# Patient Record
Sex: Male | Born: 1960 | Hispanic: No | Smoking: Former smoker
Health system: Southern US, Community
[De-identification: ages and names within clinical notes are randomized; demographics above are authoritative.]

## PROBLEM LIST (undated history)

## (undated) DIAGNOSIS — N200 Calculus of kidney: Secondary | ICD-10-CM

## (undated) DIAGNOSIS — I10 Essential (primary) hypertension: Secondary | ICD-10-CM

## (undated) DIAGNOSIS — F419 Anxiety disorder, unspecified: Secondary | ICD-10-CM

## (undated) DIAGNOSIS — E785 Hyperlipidemia, unspecified: Secondary | ICD-10-CM

## (undated) DIAGNOSIS — K219 Gastro-esophageal reflux disease without esophagitis: Secondary | ICD-10-CM

## (undated) DIAGNOSIS — E119 Type 2 diabetes mellitus without complications: Secondary | ICD-10-CM

## (undated) DIAGNOSIS — R7989 Other specified abnormal findings of blood chemistry: Secondary | ICD-10-CM

## (undated) HISTORY — PX: THYROIDECTOMY: SHX17

## (undated) HISTORY — DX: Gastro-esophageal reflux disease without esophagitis: K21.9

## (undated) HISTORY — DX: Calculus of kidney: N20.0

## (undated) HISTORY — DX: Other specified abnormal findings of blood chemistry: R79.89

## (undated) HISTORY — DX: Anxiety disorder, unspecified: F41.9

## (undated) HISTORY — DX: Hyperlipidemia, unspecified: E78.5

## (undated) HISTORY — DX: Essential (primary) hypertension: I10

## (undated) HISTORY — DX: Type 2 diabetes mellitus without complications: E11.9

---
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
## 1978-07-04 HISTORY — PX: KNEE CARTILAGE SURGERY: SHX688

## 1998-07-04 HISTORY — PX: KIDNEY STONE SURGERY: SHX686

## 2010-03-01 ENCOUNTER — Encounter (INDEPENDENT_AMBULATORY_CARE_PROVIDER_SITE_OTHER): Payer: Self-pay | Admitting: *Deleted

## 2010-05-04 ENCOUNTER — Encounter (INDEPENDENT_AMBULATORY_CARE_PROVIDER_SITE_OTHER): Payer: Self-pay | Admitting: *Deleted

## 2010-05-07 ENCOUNTER — Encounter (INDEPENDENT_AMBULATORY_CARE_PROVIDER_SITE_OTHER): Payer: Self-pay | Admitting: *Deleted

## 2010-05-07 ENCOUNTER — Ambulatory Visit: Payer: Self-pay | Admitting: Internal Medicine

## 2010-05-17 ENCOUNTER — Telehealth (INDEPENDENT_AMBULATORY_CARE_PROVIDER_SITE_OTHER): Payer: Self-pay | Admitting: *Deleted

## 2010-07-29 ENCOUNTER — Telehealth (INDEPENDENT_AMBULATORY_CARE_PROVIDER_SITE_OTHER): Payer: Self-pay | Admitting: *Deleted

## 2010-07-30 ENCOUNTER — Other Ambulatory Visit: Payer: Self-pay | Admitting: Internal Medicine

## 2010-07-30 ENCOUNTER — Ambulatory Visit
Admission: RE | Admit: 2010-07-30 | Discharge: 2010-07-30 | Payer: Self-pay | Source: Home / Self Care | Attending: Internal Medicine | Admitting: Internal Medicine

## 2010-07-30 DIAGNOSIS — D126 Benign neoplasm of colon, unspecified: Secondary | ICD-10-CM

## 2010-07-30 LAB — GLUCOSE, CAPILLARY
Glucose-Capillary: 130 mg/dL — ABNORMAL HIGH (ref 70–99)
Glucose-Capillary: 122 mg/dL — ABNORMAL HIGH (ref 70–99)

## 2010-08-03 NOTE — Progress Notes (Signed)
Summary: Prep questions for upcoming Colon  Phone Note Call from Patient Call back at [phone]   Call For: Dr Leone Payor Reason for Call: Talk to Nurse Summary of Call: Has some questions about the prep for his colon. Initial call taken by: Leanor Kail [facility],  May 17, 2010 8:50 AM  Follow-up for Phone Call        called pt; no answer.  Will try later. Ezra Sites RN  May 17, 2010 9:31 AM  Pt ate breakfast today and has not  followed instructions for holding certain foods 5 days before procedure.  He does not want to have colonoscopy tomorrow; he prefers to wait until February.  He will call back and reschedule. Follow-up by: Ezra Sites RN,  May 17, 2010 9:39 AM

## 2010-08-03 NOTE — Letter (Signed)
Summary: [name] Instructions  [facility]  [address] [place], [place] [address]   Phone: [phone]  Fax: [phone]       Charles Burton    24-Jan-1961    MRN: [id number]        Procedure Day Dorna Bloom:  [name] [birth year]     Arrival Time: 10:30am     Procedure Time: 11:30am     Location of Procedure:                    Juliann Pares _  [facility] (4th Floor)                        PREPARATION FOR COLONOSCOPY WITH MOVIPREP   Starting 5 days prior to your procedure  THURSDAY 11/10 do not eat nuts, seeds, popcorn, corn, beans, peas,  salads, or any raw vegetables.  Do not take any fiber supplements (e.g. Metamucil, Citrucel, and Benefiber).  THE DAY BEFORE YOUR PROCEDURE         DATE:  MONDAY  11/14   1.  Drink clear liquids the entire day-NO SOLID FOOD  2.  Do not drink anything colored red or purple.  Avoid juices with pulp.  No orange juice.  3.  Drink at least 64 oz. (8 glasses) of fluid/clear liquids during the day to prevent dehydration and help the prep work efficiently.  CLEAR LIQUIDS INCLUDE: Water Jello Ice Popsicles Tea (sugar ok, no milk/cream) Powdered fruit flavored drinks Coffee (sugar ok, no milk/cream) Gatorade Juice: apple, white grape, white cranberry  Lemonade Clear bullion, consomm, broth Carbonated beverages (any kind) Strained chicken noodle soup Hard Candy                             4.  In the morning, mix first dose of MoviPrep solution:    Empty 1 Pouch A and 1 Pouch B into the disposable container    Add lukewarm drinking water to the top line of the container. Mix to dissolve    Refrigerate (mixed solution should be used within 24 hrs)  5.  Begin drinking the prep at 5:00 p.m. The MoviPrep container is divided by 4 marks.   Every 15 minutes drink the solution down to the next mark (approximately 8 oz) until the full liter is complete.   6.  Follow completed prep with 16 oz of clear liquid of your choice  (Nothing red or purple).  Continue to drink clear liquids until bedtime.  7.  Before going to bed, mix second dose of MoviPrep solution:    Empty 1 Pouch A and 1 Pouch B into the disposable container    Add lukewarm drinking water to the top line of the container. Mix to dissolve    Refrigerate  THE DAY OF YOUR PROCEDURE      DATE:  TUESDAY  11/15  Beginning at  6:30 a.m. (5 hours before procedure):         1. Every 15 minutes, drink the solution down to the next mark (approx 8 oz) until the full liter is complete.  2. Follow completed prep with 16 oz. of clear liquid of your choice.    3. You may drink clear liquids until 9:30am  (2 HOURS BEFORE PROCEDURE).   MEDICATION INSTRUCTIONS  Unless otherwise instructed, you should take regular prescription medications with a small sip of water   as early as  possible the morning of your procedure.  Diabetic patients - see separate instructions.        OTHER INSTRUCTIONS  You will need a responsible adult at least 52 years of age to accompany you and drive you home.   This person must remain in the waiting room during your procedure.  Wear loose fitting clothing that is easily removed.  Leave jewelry and other valuables at home.  However, you may wish to bring a book to read or  an iPod/MP3 player to listen to music as you wait for your procedure to start.  Remove all body piercing jewelry and leave at home.  Total time from sign-in until discharge is approximately 2-3 hours.  You should go home directly after your procedure and rest.  You can resume normal activities the  day after your procedure.  The day of your procedure you should not:   Drive   Make legal decisions   Operate machinery   Drink alcohol   Return to work  You will receive specific instructions about eating, activities and medications before you leave.    The above instructions have been reviewed and explained to me by   Ezra Sites RN   May 07, 2010 11:02 AM     I fully understand and can verbalize these instructions _____________________________ Date _________

## 2010-08-03 NOTE — Miscellaneous (Signed)
Summary: [facility]  Clinical Lists Changes  Medications: Added new medication of MOVIPREP 100 GM  SOLR (PEG-KCL-NACL-NASULF-NA ASC-C) As per prep instructions. - Signed Rx of MOVIPREP 100 GM  SOLR (PEG-KCL-NACL-NASULF-NA ASC-C) As per prep instructions.;  #1 x 0;  Signed;  Entered by: Ezra Sites RN;  Authorized by: Iva Boop MD, [facility];  Method used: [facility]  [address] [phone]*, [address], [place], [place], [place]  [address], Ph: [phone] or [phone], Fax: [phone]    Prescriptions: MOVIPREP 100 GM  SOLR (PEG-KCL-NACL-NASULF-NA ASC-C) As per prep instructions.  #1 x 0   Entered by:   Ezra Sites RN   Authorized by:   Iva Boop MD, [facility]   Signed by:   Ezra Sites RN on 05/07/2010   Method used:   [facility]        [facility]  [address] [phone][facility] (retail)       [address]       [place], [place]  [address]       Ph: [phone] or [phone]       Fax: [phone]   RxID:   [id number]

## 2010-08-03 NOTE — Letter (Signed)
Summary: Previsit letter  [facility]  [address] [place], [place] [address]   Phone: [phone]  Fax: [phone]       03/01/2010 MRN: [id number]  Charles Burton [address] [place], [place]  [address]  Dear [name],  Welcome to the Gastroenterology Division at [facility].    You are scheduled to see a nurse for your pre-procedure visit on 03-25-2010 at 1:30pm on the 3rd floor at [facility], [address]  We ask that you try to arrive at our office 15 minutes prior to your appointment time to allow for check-in.  Your nurse visit will consist of discussing your medical and surgical history, your immediate family medical history, and your medications.    Please bring a complete list of all your medications or, if you prefer, bring the medication bottles and we will list them.  We will need to be aware of both prescribed and over the counter drugs.  We will need to know exact dosage information as well.  If you are on blood thinners (Coumadin, Plavix, Aggrenox, Ticlid, etc.) please call our office today/prior to your appointment, as we need to consult with your physician about holding your medication.   Please be prepared to read and sign documents such as consent forms, a financial agreement, and acknowledgement forms.  If necessary, and with your consent, a friend or relative is welcome to sit-in on the nurse visit with you.  Please bring your insurance card so that we may make a copy of it.  If your insurance requires a referral to see a specialist, please bring your referral form from your primary care physician.  No co-pay is required for this nurse visit.     If you cannot keep your appointment, please call [phone] to cancel or reschedule prior to your appointment date.  This allows [place] the opportunity to schedule an appointment for another patient in need of care.    Thank you for choosing [facility] for your medical needs.  We  appreciate the opportunity to care for you.  Please visit [place] at our website  to learn more about our practice.                     Sincerely.                                                                                                                   The Gastroenterology Division

## 2010-08-03 NOTE — Letter (Signed)
Summary: Diabetic Instructions  [facility]  [address] [place], [place] [address]   Phone: [phone]  Fax: [phone]    Charles Burton Aug 29, 1960 MRN: [id number]   _  _   ORAL DIABETIC MEDICATION INSTRUCTIONS  The day before your procedure:   Take your diabetic pill as you do normally  The day of your procedure:   Do not take your diabetic pill    We will check your blood sugar levels during the admission process and again in Recovery before discharging you home  ________________________________________________________________________

## 2010-08-04 ENCOUNTER — Encounter: Payer: Self-pay | Admitting: Internal Medicine

## 2010-08-05 NOTE — Progress Notes (Signed)
Summary: Medication  Phone Note From Pharmacy   Caller: [facility] [place] [phone] Call For: Dr. Leone Payor  Summary of Call: Moviprep is too expensive.Marland Kitchen..Marland Kitchenneeds an alternative med. Initial call taken by: Karna Christmas,  July 29, 2010 8:52 AM  Follow-up for Phone Call        Talked w/ pharmacist.  will mail rebate to pt. Follow-up by: Ezra Sites RN,  July 29, 2010 9:41 AM

## 2010-08-05 NOTE — Progress Notes (Signed)
Summary: Prep meds for procedure tomorrow  Phone Note Call from Patient Call back at Home Phone [phone]   Call For: Dr Leone Payor Summary of Call: [facility]  Initial call taken by: Leanor Kail [facility],  July 29, 2010 8:28 AM  Follow-up for Phone Call        Spoke w/ pt to review updated prep instructions.  [facility] [place],  Follow-up by: Ezra Sites RN,  July 29, 2010 8:37 AM

## 2010-08-05 NOTE — Procedures (Addendum)
Summary: Colonoscopy  Patient: Charles Burton Note: All result statuses are Final unless otherwise noted.  Tests: (1) Colonoscopy (COL)   COL Colonoscopy           DONE      [facility]     [address]     [place], [place]  [address]           COLONOSCOPY PROCEDURE REPORT           PATIENT:  [name], [name]  MR#:  [id number]     BIRTHDATE:  1960-08-20, 49 yrs. old  GENDER:  male     ENDOSCOPIST:  Iva Boop, MD, [facility]     REF. BY:  Nila Nephew, M.D.     PROCEDURE DATE:  07/30/2010     PROCEDURE:  Colonoscopy with biopsy and snare polypectomy     ASA CLASS:  Class II     INDICATIONS:  Routine Risk Screening     MEDICATIONS:   Fentanyl 50 mcg IV, Versed 8 mg IV           DESCRIPTION OF PROCEDURE:   After the risks benefits and     alternatives of the procedure were thoroughly explained, informed     consent was obtained.  Digital rectal exam was performed and     revealed no abnormalities and normal prostate.   The LB CF-H180AL     [id number] endoscope was introduced through the anus and advanced to     the cecum, which was identified by both the appendix and ileocecal     valve, without limitations.  The quality of the prep was     excellent, using MoviPrep.  The instrument was then slowly     withdrawn as the colon was fully examined. Insertion: 1:55 minutes     Withdrawal: 17:21 minutes     <<PROCEDUREIMAGES>>           FINDINGS:  Two polyps were found. They were diminutive. The     descending polyp (4-1mm) was snared without cautery. Retrieval was     successful. The possible splenic flexure polyp (2-20mm) was removed     using cold biopsy forceps.  Moderate diverticulosis was found in     the sigmoid colon.  This was otherwise a normal examination of the     colon.   Retroflexed views in the right colon and rectum revealed     no abnormalities.    The scope was then withdrawn from the patient     and the procedure completed.           COMPLICATIONS:  None  ENDOSCOPIC IMPRESSION:     1) Two diminutive polyps removed from left colon     2) Moderate diverticulosis in the sigmoid colon     3) Otherwise normal examination with excellent prep           REPEAT EXAM:  In for Colonoscopy, pending biopsy results.           Iva Boop, MD, [facility]           CC:  Nila Nephew, MD     The Patient           n.     Rosalie Doctor:   Iva Boop at 07/30/2010 09:05 AM           Jeri Modena, [phone]  Note: An exclamation mark (!) indicates a result that was not dispersed into the flowsheet. Document Creation Date: 07/30/2010  9:06 AM _______________________________________________________________________  (1) Order result status: Final Collection or observation date-time: 07/30/2010 08:56 Requested date-time:  Receipt date-time:  Reported date-time:  Referring Physician:   Ordering Physician: Stan Head [phone]) Specimen Source:  Source: Launa Grill Order Number: [id number] Lab site:   Appended Document: Colonoscopy     Procedures Next Due Date:    Colonoscopy: 07/2020

## 2010-08-11 NOTE — Letter (Signed)
Summary: Patient Notice-Hyperplastic Polyps  [facility]  [address] [place], [place] [address]   Phone: [phone]  Fax: [phone]        August 04, 2010 MRN: [id number]    HOMERO HYSON [address] [place], [place]  [address]    Dear Mr. MEROLLA,  I am pleased to inform you that the colon polyps removed during your recent colonoscopy were found to be hyperplastic.  These types of polyps are NOT pre-cancerous.  It is therefore my recommendation that you have a repeat colonoscopy examination in 10 years for routine colorectal cancer screening.  In addition to repeating colonoscopy, changing health habits may reduce your risk of having more colon or rectal  polyps and possibly, colorectal cancer. You may lower your risk of future polyps and colorectal cancer by adopting healthy habits such as not smoking or using tobacco (if you do), being physically active, losing weight (if overweight), and eating a diet which includes fruits and vegetables and limits red meat.  Should you develop new or worsening symptoms of abdominal pain, bowel habit changes or bleeding from the rectum or bowels, please schedule an evaluation with either your primary care physician or with me.  Please call us if you are having persistent problems or have questions about your condition that have not been fully answered at this time.  Sincerely,  Iva Boop MD, [facility] This letter has been electronically signed by your physician.  Appended Document: Patient Notice-Hyperplastic Polyps letter mailed

## 2012-04-16 ENCOUNTER — Encounter (INDEPENDENT_AMBULATORY_CARE_PROVIDER_SITE_OTHER): Payer: Self-pay

## 2012-05-01 ENCOUNTER — Ambulatory Visit (INDEPENDENT_AMBULATORY_CARE_PROVIDER_SITE_OTHER): Payer: Self-pay | Admitting: General Surgery

## 2012-06-05 ENCOUNTER — Encounter (INDEPENDENT_AMBULATORY_CARE_PROVIDER_SITE_OTHER): Payer: Self-pay | Admitting: General Surgery

## 2012-06-05 ENCOUNTER — Ambulatory Visit (INDEPENDENT_AMBULATORY_CARE_PROVIDER_SITE_OTHER): Payer: PRIVATE HEALTH INSURANCE | Admitting: General Surgery

## 2012-06-05 VITALS — BP 128/86 | HR 72 | Temp 98.2°F | Resp 16 | Ht 70.0 in | Wt 194.0 lb

## 2012-06-05 DIAGNOSIS — K429 Umbilical hernia without obstruction or gangrene: Secondary | ICD-10-CM

## 2012-06-05 NOTE — H&P (Signed)
Charles Burton is an 51 y.o. male.   Chief Complaint: Periumbilical pain with hernia HPI: Has had umbilical hernia for > 10 years, increaingly symptomatic over the past several months with some weight gain.  Never incarcerated.  Gets worse with abdominal bloating.  Past Medical History  Diagnosis Date  . Hyperlipidemia   . Kidney stones   . Diabetes mellitus without complication   . Hypertension   . Anxiety   . Low testosterone   . GERD (gastroesophageal reflux disease)     Past Surgical History  Procedure Date  . Thyroidectomy   . Knee cartilage surgery 1980  . Nose surgery 1978  . Kidney stone surgery 2000    No family history on file. Social History:  reports that he has been smoking Cigars.  He does not have any smokeless tobacco history on file. He reports that he drinks alcohol. His drug history not on file.  Allergies: Allergies no known allergies   (Not in a hospital admission)  No results found for this or any previous visit (from the past 48 hour(s)). No results found.  Review of Systems  Constitutional: Negative.   HENT: Negative.   Gastrointestinal: Positive for abdominal pain (periumbilical).  Genitourinary: Negative.   Musculoskeletal: Negative.   Skin: Negative.   Neurological: Negative.   Endo/Heme/Allergies: Negative.   Psychiatric/Behavioral: Positive for depression.    Blood pressure 128/86, pulse 72, temperature 98.2 F (36.8 C), temperature source Temporal, resp. rate 16, height 5\' 10"  (1.778 m), weight 194 lb (87.998 kg). Physical Exam  Constitutional: He is oriented to person, place, and time. He appears well-developed and well-nourished.  HENT:  Head: Normocephalic and atraumatic.  Eyes: Conjunctivae normal and EOM are normal. Pupils are equal, round, and reactive to light.  Neck: Normal range of motion. Neck supple.  Cardiovascular: Normal rate, regular rhythm, normal heart sounds and intact distal pulses.   Respiratory: Effort normal  and breath sounds normal.  GI: Soft. Normal appearance. There is tenderness in the periumbilical area. A hernia is present. Hernia confirmed positive in the ventral area (umbilical). Hernia confirmed negative in the right inguinal area and confirmed negative in the left inguinal area.    Musculoskeletal: Normal range of motion.  Neurological: He is alert and oriented to person, place, and time. He has normal reflexes.  Skin: Skin is warm and dry.  Psychiatric: He has a normal mood and affect. His behavior is normal. Judgment and thought content normal.     Assessment/Plan Umbilical hernia with symptoms, reducible  Repair with possible circular mesh as outpatient. Perioperative antibiotics  Ivone Licht O 06/05/2012, 9:57 AM

## 2012-06-05 NOTE — Progress Notes (Signed)
Office visit note entered as H&P  Charles Burton. Gae Bon, MD, FACS [phone] [facility]

## 2012-11-30 ENCOUNTER — Other Ambulatory Visit (HOSPITAL_COMMUNITY): Payer: Self-pay | Admitting: Internal Medicine

## 2012-11-30 DIAGNOSIS — R079 Chest pain, unspecified: Secondary | ICD-10-CM

## 2012-12-20 ENCOUNTER — Ambulatory Visit (HOSPITAL_COMMUNITY)
Admission: RE | Admit: 2012-12-20 | Discharge: 2012-12-20 | Disposition: A | Discharge status: Home / Self Care | Payer: No Typology Code available for payment source | Source: Ambulatory Visit | Attending: Internal Medicine | Admitting: Internal Medicine

## 2012-12-20 DIAGNOSIS — R079 Chest pain, unspecified: Secondary | ICD-10-CM | POA: Insufficient documentation

## 2016-10-08 ENCOUNTER — Encounter (HOSPITAL_COMMUNITY): Payer: Self-pay | Admitting: Emergency Medicine

## 2016-10-08 ENCOUNTER — Ambulatory Visit (HOSPITAL_COMMUNITY)
Admission: EM | Admit: 2016-10-08 | Discharge: 2016-10-08 | Disposition: A | Discharge status: Home / Self Care | Payer: BLUE CROSS/BLUE SHIELD | Attending: Internal Medicine | Admitting: Internal Medicine

## 2016-10-08 DIAGNOSIS — J111 Influenza due to unidentified influenza virus with other respiratory manifestations: Secondary | ICD-10-CM

## 2016-10-08 DIAGNOSIS — R69 Illness, unspecified: Secondary | ICD-10-CM | POA: Diagnosis not present

## 2016-10-08 DIAGNOSIS — H6121 Impacted cerumen, right ear: Secondary | ICD-10-CM

## 2016-10-08 MED ORDER — OSELTAMIVIR PHOSPHATE 75 MG PO CAPS: 75.0000 mg | ORAL_CAPSULE | Freq: Two times a day (BID) | ORAL | 0 refills | Status: DC

## 2016-10-08 MED ORDER — BENZONATATE 100 MG PO CAPS: 100.0000 mg | ORAL_CAPSULE | Freq: Three times a day (TID) | ORAL | 0 refills | Status: DC

## 2016-10-08 NOTE — Discharge Instructions (Signed)
You most likely have a viral URI such as influenza or an influenza like illness, I advise rest, plenty of fluids and management of symptoms with over the counter medicines. For symptoms you may take Tylenol as needed every 4-6 hours for body aches or fever, not to exceed 4,000 mg a day, Take mucinex or mucinex DM ever 12 hours with a full glass of water, you may use an inhaled steroid such as Flonase, 2 sprays each nostril once a day for congestion, or an antihistamine such as Claritin or Zyrtec once a day. For treatment of influenza, I have prescribed Tamiflu. Take 1 tablet twice a day for 5 days. For cough, I have prescribed a medication called Tessalon. Take 1 tablet every 8 hours as needed for your cough.  Should your symptoms worsen or fail to resolve, follow up with your primary care provider or return to clinic.

## 2016-10-08 NOTE — ED Triage Notes (Signed)
Cough, fever, productive cough of dark brown phlegm and streaks of blood.  Reports fever of 103 today.  Onset of symptoms 2 days ago and worsened over the past 2 days

## 2016-10-08 NOTE — ED Provider Notes (Signed)
CSN: [id number]     Arrival date & time 10/08/16  1429 History   First MD Initiated Contact with Patient 10/08/16 1610     Chief Complaint  Patient presents with  . URI   (Consider location/radiation/quality/duration/timing/severity/associated sxs/prior Treatment) 56 year old male presents with a 24-hour history of headache, body ache, muscle aches, fever, and fatigue unrelieved with OTC medications.   The history is provided by the patient.  URI  Presenting symptoms: congestion, cough, fatigue, fever and sore throat   Cough:    Cough characteristics:  Non-productive, dry and hacking   Sputum characteristics:  [place]   Severity:  Moderate   Onset quality:  Gradual   Duration:  1 day   Timing:  Intermittent   Progression:  Worsening   Chronicity:  New Severity:  Moderate Onset quality:  Gradual Duration:  1 day Timing:  Constant Progression:  Worsening Chronicity:  New Relieved by:  Nothing Worsened by:  Nothing Ineffective treatments:  Decongestant, OTC medications and rest Associated symptoms: arthralgias, headaches, myalgias and sinus pain   Associated symptoms: no neck pain, no sneezing and no wheezing     Past Medical History:  Diagnosis Date  . Anxiety   . Diabetes mellitus without complication ([place])   . GERD (gastroesophageal reflux disease)   . Hyperlipidemia   . Hypertension   . Kidney stones   . Low testosterone    Past Surgical History:  Procedure Laterality Date  . KIDNEY STONE SURGERY  2000  . [employer]  . NOSE SURGERY  1978  . THYROIDECTOMY     No family history on file. Social History  Substance Use Topics  . Smoking status: Current Some Day Smoker    Types: Cigars  . Smokeless tobacco: Not on file  . Alcohol use Yes    Review of Systems  Constitutional: Positive for appetite change, chills, fatigue and fever.  HENT: Positive for congestion, sinus pain and sore throat. Negative for sneezing and trouble swallowing.   Eyes:  Negative for itching and visual disturbance.  Respiratory: Positive for cough. Negative for wheezing.   Cardiovascular: Negative for chest pain and palpitations.  Gastrointestinal: Positive for nausea. Negative for abdominal pain, constipation and vomiting.  Genitourinary: Positive for dysuria and frequency.  Musculoskeletal: Positive for arthralgias and myalgias. Negative for neck pain and neck stiffness.  Neurological: Positive for headaches. Negative for dizziness and light-headedness.    Allergies  Patient has no known allergies.  Home Medications   Prior to Admission medications   Medication Sig Start Date End Date Taking? Authorizing Provider  amphetamine-dextroamphetamine (ADDERALL) 20 MG tablet Take 20 mg by mouth daily.   Yes Historical Provider, MD  aspirin 81 MG tablet Take 81 mg by mouth daily.   Yes Historical Provider, MD  atorvastatin (LIPITOR) 80 MG tablet  05/18/12  Yes Historical Provider, MD  buPROPion (WELLBUTRIN XL) 300 MG 24 hr tablet Take 300 mg by mouth daily.   Yes Historical Provider, MD  clonazePAM (KLONOPIN) 0.5 MG tablet Take 0.5 mg by mouth 3 (three) times daily as needed.   Yes Historical Provider, MD  sertraline (ZOLOFT) 100 MG tablet Take 100 mg by mouth daily.   Yes Historical Provider, MD  benzonatate (TESSALON) 100 MG capsule Take 1 capsule (100 mg total) by mouth every 8 (eight) hours. 10/08/16   Barnet Glasgow, NP  glimepiride (AMARYL) 2 MG tablet Take 2 mg by mouth daily before breakfast.    Historical Provider, MD  Lancets (ONETOUCH ULTRASOFT)  lancets  04/04/12   Historical Provider, MD  lisinopril (PRINIVIL,ZESTRIL) 20 MG tablet Take 20 mg by mouth daily.    Historical Provider, MD  ONE TOUCH ULTRA TEST test strip  04/04/12   Historical Provider, MD  oseltamivir (TAMIFLU) 75 MG capsule Take 1 capsule (75 mg total) by mouth every 12 (twelve) hours. 10/08/16   Barnet Glasgow, NP  pantoprazole (PROTONIX) 40 MG tablet Take 40 mg by mouth daily.     Historical Provider, MD  tadalafil (CIALIS) 20 MG tablet Take 20 mg by mouth daily as needed.    Historical Provider, MD  Testosterone (TESTIM TD) Place onto the skin.    Historical Provider, MD  triazolam (HALCION) 0.25 MG tablet Take 0.25 mg by mouth at bedtime as needed.    Historical Provider, MD   Meds Ordered and Administered this Visit  Medications - No data to display  BP 128/82 (BP Location: Left Arm)   Pulse 98   Temp 100 F (37.8 C) (Oral)   Resp 18   SpO2 98%  No data found.   Physical Exam  Constitutional: He is oriented to person, place, and time. He appears well-developed and well-nourished. He appears ill. No distress.  HENT:  Head: Normocephalic and atraumatic.  Right Ear: External ear normal.  Left Ear: Tympanic membrane and external ear normal.  Nose: Nose normal. Right sinus exhibits no maxillary sinus tenderness and no frontal sinus tenderness. Left sinus exhibits no maxillary sinus tenderness and no frontal sinus tenderness.  Mouth/Throat: Uvula is midline and oropharynx is clear and moist. No oropharyngeal exudate.  Right sided cerumen impaction  Eyes: Conjunctivae are normal. Right eye exhibits no discharge. Left eye exhibits no discharge.  Neck: Normal range of motion. Neck supple. No JVD present.  Cardiovascular: Normal rate and regular rhythm.   Pulmonary/Chest: Effort normal and breath sounds normal. No respiratory distress. He has no wheezes.  Abdominal: Soft. Bowel sounds are normal.  Lymphadenopathy:    He has no cervical adenopathy.  Neurological: He is alert and oriented to person, place, and time.  Skin: Skin is warm and dry. Capillary refill takes less than 2 seconds. No rash noted. He is not diaphoretic. No erythema.  Psychiatric: He has a normal mood and affect. His behavior is normal.  Nursing note and vitals reviewed.   Urgent Care Course     Procedures (including critical care time)  Labs Review Labs Reviewed - No data to  display  Imaging Review No results found.     MDM   1. Influenza-like illness   2. Impacted cerumen of right ear     Cerumen impaction cleared, TM appearing intact and normal. Pt expressing relief.  For influenza like illness, treating with Tamiflu and tessalon, provided counseling on OTC medications for symptom relief and to rest and drink fluids. Advised to follow up with PCP if symptoms persist or fail to resolve.    Barnet Glasgow, NP 10/08/16 1650

## 2017-07-19 DIAGNOSIS — E291 Testicular hypofunction: Secondary | ICD-10-CM | POA: Diagnosis not present

## 2017-07-22 DIAGNOSIS — J209 Acute bronchitis, unspecified: Secondary | ICD-10-CM | POA: Diagnosis not present

## 2017-10-13 DIAGNOSIS — J40 Bronchitis, not specified as acute or chronic: Secondary | ICD-10-CM | POA: Diagnosis not present

## 2017-10-13 DIAGNOSIS — E119 Type 2 diabetes mellitus without complications: Secondary | ICD-10-CM | POA: Diagnosis not present

## 2017-10-17 ENCOUNTER — Ambulatory Visit
Admission: RE | Admit: 2017-10-17 | Discharge: 2017-10-17 | Disposition: A | Discharge status: Home / Self Care | Payer: BLUE CROSS/BLUE SHIELD | Source: Ambulatory Visit | Attending: Internal Medicine | Admitting: Internal Medicine

## 2017-10-17 ENCOUNTER — Other Ambulatory Visit: Payer: Self-pay | Admitting: Internal Medicine

## 2017-10-17 DIAGNOSIS — R059 Cough, unspecified: Secondary | ICD-10-CM

## 2017-10-17 DIAGNOSIS — R066 Hiccough: Secondary | ICD-10-CM

## 2017-10-17 DIAGNOSIS — R05 Cough: Secondary | ICD-10-CM | POA: Diagnosis not present

## 2017-10-18 DIAGNOSIS — J988 Other specified respiratory disorders: Secondary | ICD-10-CM | POA: Diagnosis not present

## 2017-10-25 DIAGNOSIS — R066 Hiccough: Secondary | ICD-10-CM | POA: Diagnosis not present

## 2017-10-25 DIAGNOSIS — E119 Type 2 diabetes mellitus without complications: Secondary | ICD-10-CM | POA: Diagnosis not present

## 2017-10-25 DIAGNOSIS — I10 Essential (primary) hypertension: Secondary | ICD-10-CM | POA: Diagnosis not present

## 2018-02-16 DIAGNOSIS — E1165 Type 2 diabetes mellitus with hyperglycemia: Secondary | ICD-10-CM | POA: Diagnosis not present

## 2018-02-16 DIAGNOSIS — I10 Essential (primary) hypertension: Secondary | ICD-10-CM | POA: Diagnosis not present

## 2018-02-16 DIAGNOSIS — N529 Male erectile dysfunction, unspecified: Secondary | ICD-10-CM | POA: Diagnosis not present

## 2018-03-14 DIAGNOSIS — E291 Testicular hypofunction: Secondary | ICD-10-CM | POA: Diagnosis not present

## 2018-03-14 DIAGNOSIS — F909 Attention-deficit hyperactivity disorder, unspecified type: Secondary | ICD-10-CM | POA: Diagnosis not present

## 2018-03-14 DIAGNOSIS — E1165 Type 2 diabetes mellitus with hyperglycemia: Secondary | ICD-10-CM | POA: Diagnosis not present

## 2018-03-14 DIAGNOSIS — I10 Essential (primary) hypertension: Secondary | ICD-10-CM | POA: Diagnosis not present

## 2018-03-14 DIAGNOSIS — Z23 Encounter for immunization: Secondary | ICD-10-CM | POA: Diagnosis not present

## 2018-03-14 DIAGNOSIS — Z1211 Encounter for screening for malignant neoplasm of colon: Secondary | ICD-10-CM | POA: Diagnosis not present

## 2018-05-07 DIAGNOSIS — H6692 Otitis media, unspecified, left ear: Secondary | ICD-10-CM | POA: Diagnosis not present

## 2018-05-29 DIAGNOSIS — E291 Testicular hypofunction: Secondary | ICD-10-CM | POA: Diagnosis not present

## 2018-06-13 DIAGNOSIS — E119 Type 2 diabetes mellitus without complications: Secondary | ICD-10-CM | POA: Diagnosis not present

## 2018-06-13 DIAGNOSIS — I10 Essential (primary) hypertension: Secondary | ICD-10-CM | POA: Diagnosis not present

## 2018-08-15 DIAGNOSIS — M722 Plantar fascial fibromatosis: Secondary | ICD-10-CM | POA: Diagnosis not present

## 2018-08-15 DIAGNOSIS — E1165 Type 2 diabetes mellitus with hyperglycemia: Secondary | ICD-10-CM | POA: Diagnosis not present

## 2018-09-07 DIAGNOSIS — E291 Testicular hypofunction: Secondary | ICD-10-CM | POA: Diagnosis not present

## 2018-11-19 DIAGNOSIS — E291 Testicular hypofunction: Secondary | ICD-10-CM | POA: Diagnosis not present

## 2018-11-27 DIAGNOSIS — H6121 Impacted cerumen, right ear: Secondary | ICD-10-CM | POA: Diagnosis not present

## 2019-01-08 DIAGNOSIS — E291 Testicular hypofunction: Secondary | ICD-10-CM | POA: Diagnosis not present

## 2019-03-18 DIAGNOSIS — E291 Testicular hypofunction: Secondary | ICD-10-CM | POA: Diagnosis not present

## 2019-03-18 DIAGNOSIS — Z23 Encounter for immunization: Secondary | ICD-10-CM | POA: Diagnosis not present

## 2019-04-17 DIAGNOSIS — E291 Testicular hypofunction: Secondary | ICD-10-CM | POA: Diagnosis not present

## 2019-05-15 DIAGNOSIS — E1165 Type 2 diabetes mellitus with hyperglycemia: Secondary | ICD-10-CM | POA: Diagnosis not present

## 2019-05-15 DIAGNOSIS — E291 Testicular hypofunction: Secondary | ICD-10-CM | POA: Diagnosis not present

## 2019-05-15 DIAGNOSIS — N200 Calculus of kidney: Secondary | ICD-10-CM | POA: Diagnosis not present

## 2019-05-15 DIAGNOSIS — F909 Attention-deficit hyperactivity disorder, unspecified type: Secondary | ICD-10-CM | POA: Diagnosis not present

## 2019-06-07 DIAGNOSIS — E291 Testicular hypofunction: Secondary | ICD-10-CM | POA: Diagnosis not present

## 2019-08-10 IMAGING — CR DG CHEST 2V
2 series · 2 of 2 positions shown · non-contrast
Comparison: None.

CLINICAL DATA: Cough.  Hiccups

EXAM:
CHEST - 2 VIEW

[w chest pa]
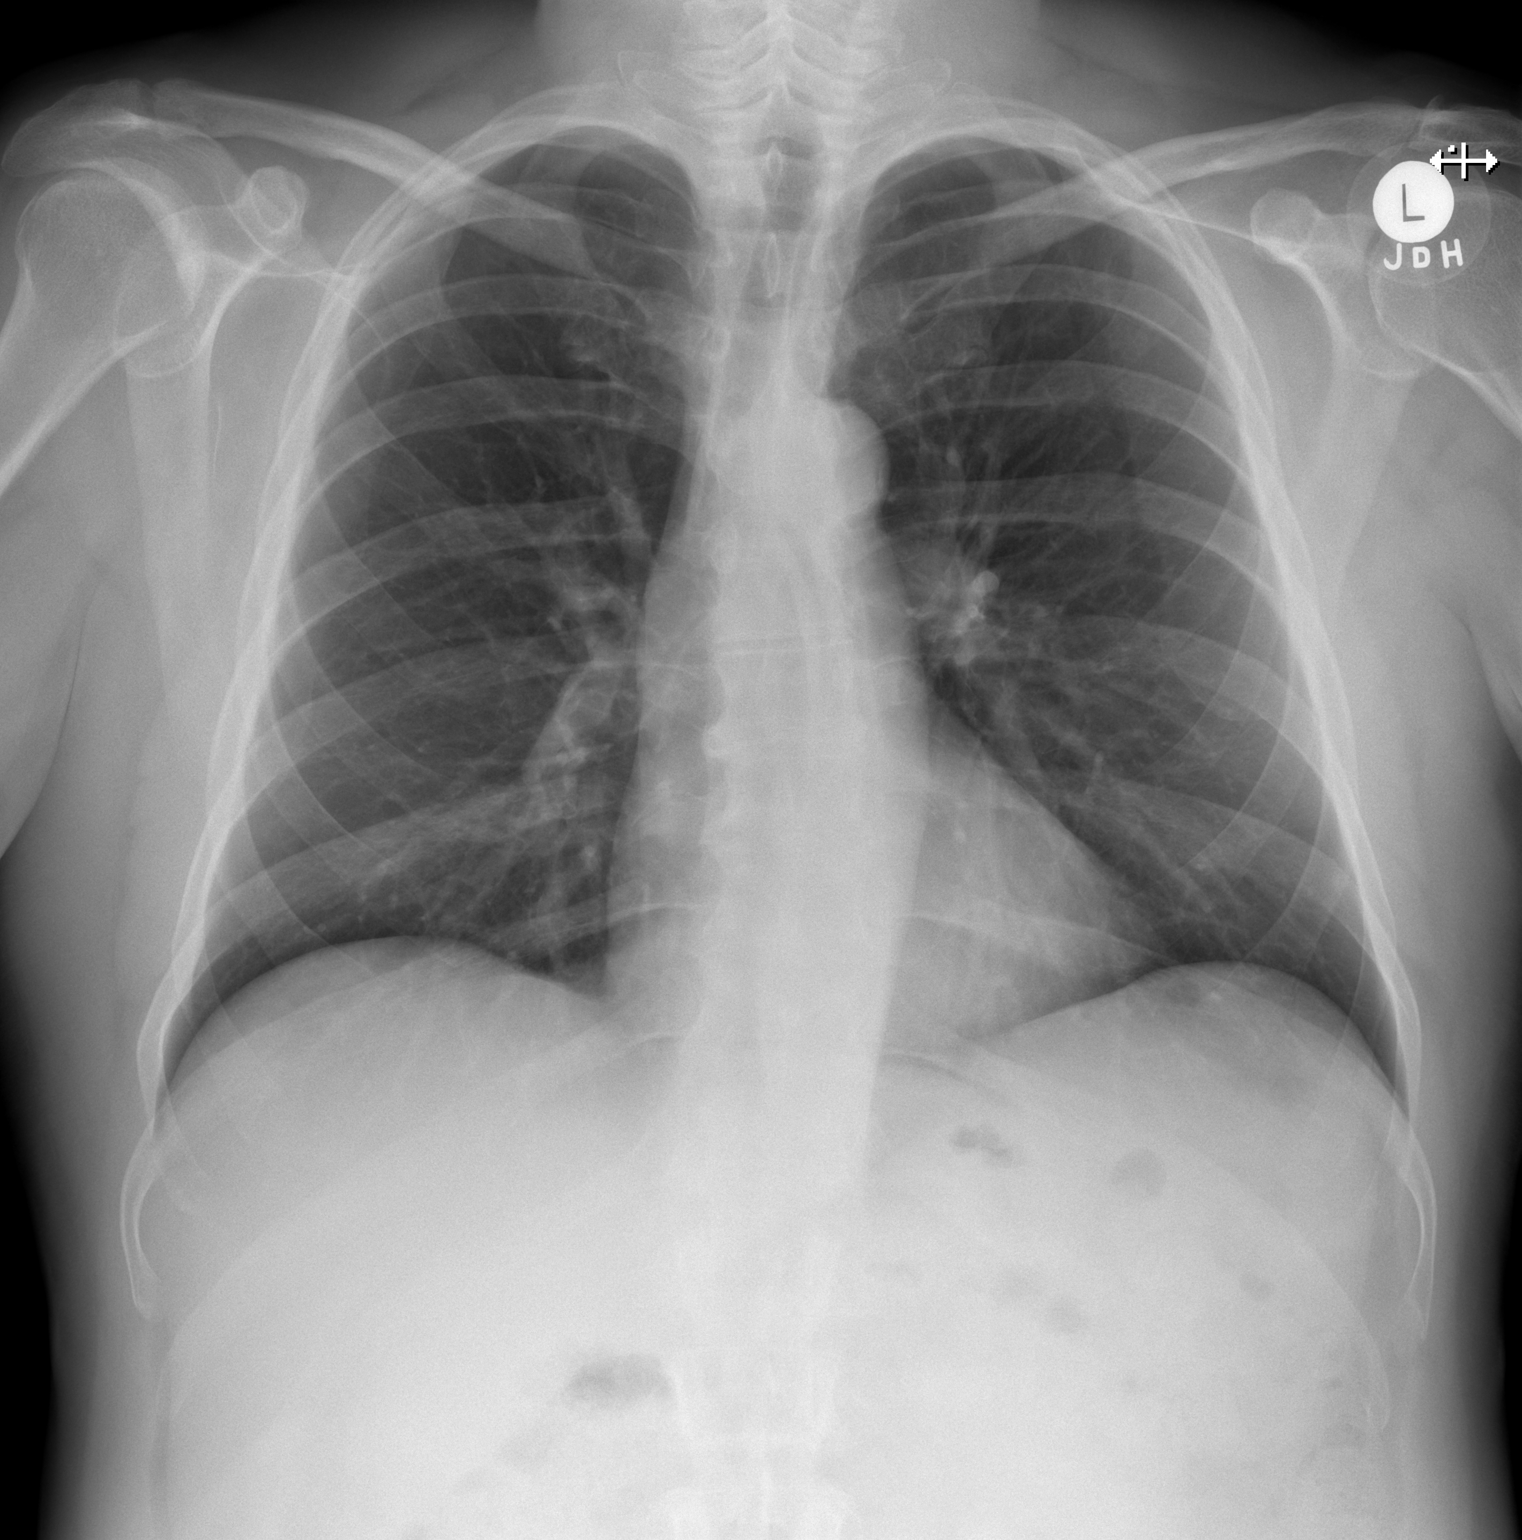

[w chest lat]
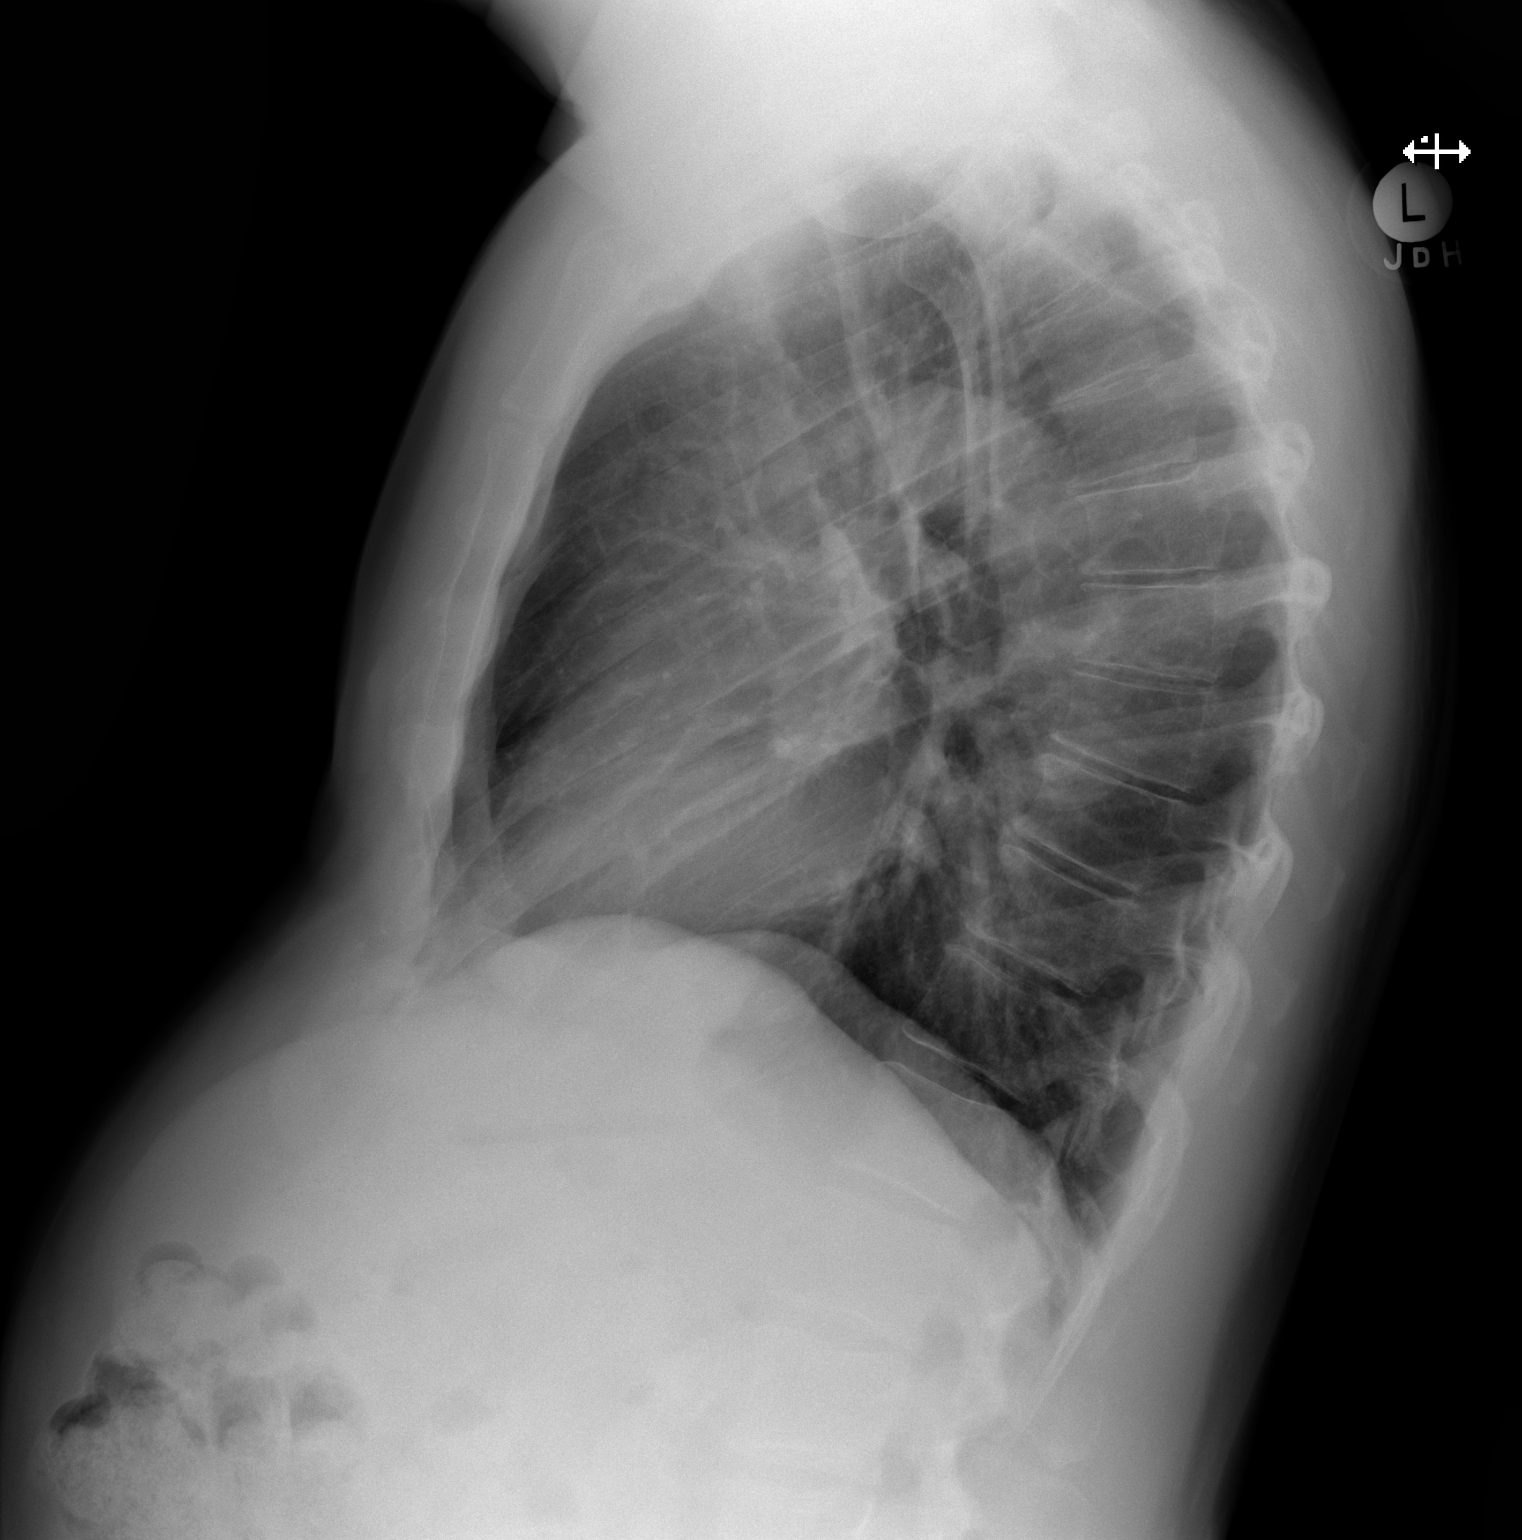

[2 of 2 positions shown; findings below may reference images not displayed]

FINDINGS: Normal heart size and mediastinal contours. No acute infiltrate or
edema. No effusion or pneumothorax. Spondylosis and mild kyphosis.
No acute osseous findings.
IMPRESSION: No evidence of cardiopulmonary disease.

## 2021-02-03 ENCOUNTER — Encounter: Payer: Self-pay | Admitting: Internal Medicine

## 2023-06-02 LAB — COLOGUARD: COLOGUARD: NEGATIVE

## 2023-11-10 NOTE — Progress Notes (Addendum)
 now part of     [facility], 3rd floor [address] [place], [place]  [address] [phone]  Fax: [phone]      AWV <redacted file path>    Well <redacted file path>    Hm <redacted file path>   Imm <redacted file path>   Labs <redacted file path>  PMHx <redacted file path>   Prob <redacted file path>    Medications <redacted file path>  Plan <redacted file path>   Charles Burton is a 63 y.o. male with the following:  Patient Active Problem List   Diagnosis Date Noted  . Hyperlipidemia associated with type 2 diabetes mellitus (HCC) 09/02/2019  . Hypertension associated with type 2 diabetes mellitus (HCC) 09/02/2019  . Low vitamin B12 level 07/15/2020  . Attention deficit disorder (ADD) in adult 09/02/2019  . History of partial thyroidectomy 09/02/2019  . Erectile dysfunction 05/04/2021  . Epidermal inclusion cyst 09/14/2023  . Trace edema 01/11/2023  . Herpes zoster with ophthalmic complication 04/20/2022  . History of nonmelanoma skin cancer 04/20/2022  . Actinic keratosis 04/20/2022  . Controlled substance agreement signed 10/21/2020  . Hypogonadism in male 09/02/2019  . Umbilical hernia 06/05/2012    evaluated in the office today for Medicare Annual Wellness Visit Subsequent (AWV completed today /Has questions about Ozempic).   -- ASSESSMENT / PLAN <redacted file path>   Vitals <redacted file path>: BP 122/79   Pulse 81   Temp 97.9 F (36.6 C)   Ht 1.778 m (5' 10)   Wt 79.7 kg (175 lb 9.6 oz)   SpO2 98%   BMI 25.20 kg/m    -- Diagnosis on 11/10/2023: 1. Hyperlipidemia associated with type 2 diabetes mellitus (HCC)   2. Attention deficit disorder (ADD) in adult   3. Erectile dysfunction, unspecified erectile dysfunction type   4. Hypertension associated with type 2 diabetes mellitus (HCC)   5. Low vitamin B12 level   6. Trace edema   7. History of partial thyroidectomy   8. Diabetes  mellitus type 2 with complications    (CMD)   9. Actinic keratosis   10. History of nonmelanoma skin cancer   11. Screening for malignant neoplasm of prostate   12. Encounter for Medicare annual wellness exam      -- Refills & New/Modified Rx's: Orders Placed This Encounter  Medications  . amLODIPine-benazepril (LOTREL 5-40) 5-40 mg per capsule    Sig: Take one capsule by mouth daily.    Dispense:  90 capsule    Refill:  1    Take 1 capsule by mouth daily.  . atorvastatin  (LIPITOR ) 80 mg tablet    Sig: Take one tablet (80 mg total) by mouth nightly.    Dispense:  90 tablet    Refill:  3    Take 1 tablet (80 mg total) by mouth nightly.  SABRA dextroamphetamine-amphetamine (ADDERALL XR) 20 mg 24 hr capsule    Sig: Take two capsules (40 mg total) by mouth every morning.    Dispense:  60 capsule    Refill:  0  . glipiZIDE (GLUCOTROL XL) 2.5 mg 24 hr tablet    Sig: Take one tablet (2.5 mg total) by mouth daily.    Dispense:  90 tablet    Refill:  1    Take 1 tablet (2.5 mg total) by mouth daily.  . metFORMIN (GLUCOPHAGE-XR) 500 mg 24 hr tablet  Sig: Take two tablets (1,000 mg total) by mouth in the morning and two tablets (1,000 mg total) in the evening. Take with meals.    Dispense:  360 tablet    Refill:  1    Take 2 tablets (1,000 mg total) by mouth 2 times daily with meals.  . pioglitazone (ACTOS) 45 mg tablet    Sig: Take one tablet (45 mg total) by mouth daily.    Dispense:  90 tablet    Refill:  1    Take 1 tablet (45 mg total) by mouth daily.  . tadalafiL (CIALIS) 5 mg tablet    Sig: Take one tablet (5 mg total) by mouth daily as needed for erectile dysfunction.    Dispense:  30 tablet    Refill:  11  . tirbanibulin (Klisyri) 1 % oipk    Sig: Apply thin layer to scalp once daily for 5 days.    Dispense:  5 packet    Refill:  1  . semaglutide (Ozempic) 1 mg/dose (4 mg/3 mL) subcutaneous pen injector    Sig: Inject one mg under the skin every 7 days.    Dispense:  3 mL     Refill:  2    -- Labs / Studies & Referrals: Orders Placed This Encounter  Procedures  . CBC with Differential  . Comprehensive Metabolic Panel  . Lipid Panel  . TSH With Reflex To Free T4  . Hemoglobin A1C With Estimated Average Glucose  . Albumin, Random Urine  . Vitamin B12  . PSA, Total (Screen)    -- D/C Medications: Medications Discontinued During This Encounter  Medication Reason  . tirzepatide (Mounjaro) 2.5 mg/0.5 mL subcutaneous injection Availability  . dextroamphetamine-amphetamine (ADDERALL XR) 20 mg 24 hr capsule Expired  . dextroamphetamine-amphetamine (ADDERALL XR) 20 mg 24 hr capsule Expired  . pioglitazone (ACTOS) 45 mg tablet Reorder  . metFORMIN (GLUCOPHAGE-XR) 500 mg 24 hr tablet Reorder  . glipiZIDE (GLUCOTROL XL) 2.5 mg 24 hr tablet Reorder  . amLODIPine-benazepril (LOTREL 5-40) 5-40 mg per capsule Reorder  . atorvastatin  (LIPITOR ) 80 mg tablet Reorder  . tadalafiL (CIALIS) 5 mg tablet Reorder  . tirbanibulin (Klisyri) 1 % oipk Reorder  . dextroamphetamine-amphetamine (ADDERALL XR) 20 mg 24 hr capsule Reorder  . semaglutide (Ozempic) 0.25 mg or 0.5 mg (2 mg/3 mL) pen injector Dose adjustment     Assessment & Plan 1. Diabetes Mellitus. - His A1c levels have decreased from 9 to 8.2, indicating a positive response to the current medication regimen. He has also experienced a weight loss of approximately 10 pounds. - The potential side effects of Ozempic, including constipation, were discussed. He was advised to incorporate Metamucil into his diet, starting with a quarter tablespoon daily and gradually increasing to 1 to 2 tablespoons mixed with 8 to 12 ounces of water. - He was encouraged to maintain a diet rich in vitamin C, protein, and fiber. - The dosage of Ozempic will be increased to 1 mg.  2. Hypertension. - He is currently taking amlodipine and benazepril. - Blood pressure medications are effectively managing his condition. - A prescription for  a 90-day supply of amlodipine and benazepril will be provided. - Monitoring of blood pressure will continue.  3. Hyperlipidemia. - He is taking atorvastatin . - Lipid levels are being managed with atorvastatin . - A prescription for a 90-day supply of atorvastatin  will be provided. - Lipid profile will be monitored.  4. Actinic Keratosis. - He is under the care of Dr.  Dorfla for this condition. - Skin condition is being managed with dermatological care. - A prescription for Clarityne will be provided. - Follow-up with dermatologist will continue.  5. Shoulder Pain. - His shoulder pain may be attributed to reduced muscle tone, which can result in joint instability and inflammation, leading to a loss of range of motion. - Physical examination reveals improved range of motion compared to previous visits. - He was advised to perform range of motion exercises, stretching, gentle weightlifting, and strengthening exercises for his shoulder, with a focus on improving range of motion. - He was encouraged to visit the American Academy [employer]geons website for specific exercises or physical therapy type exercises for various joints.  6. Neuropathy. - The symptoms of neuropathy, including numbness, tingling, and burning sensations, were discussed. - Physical examination did not reveal significant neuropathic changes. - The importance of maintaining B12 levels and blood sugar control to prevent neuropathy was emphasized. - Monitoring for progression of neuropathy symptoms will continue.  7. Sleep Apnea. - The potential benefits of a sleep study were discussed, particularly if he experiences symptoms such as snoring, morning headaches, congestion, sore throat, dry mouth, or fatigue. - He was informed about various apps that can provide information about his sleep patterns. - Referral for a sleep study will be considered if symptoms persist.  8. Health Maintenance. - The potential side  effects of the shingles vaccine were discussed, including the rare occurrence of Guillain-Barre syndrome and stroke. - He was advised to receive the Shingrix vaccine at pharmacy. - Monitoring for vaccine side effects will be conducted.  9. Memory Loss. - The causes of memory loss, including stress, age, B12 deficiency, and high blood sugar levels, were discussed. - A MoCA test will be conducted today. - Results of the MoCA test will be reviewed and compared in future visits to monitor cognitive function. -- Follow-up: <redacted file path> No follow-ups on file.   -- Pharmacy: <redacted file path> [id number] - [place], [place] - [address] [place] [address] - PHONE: [phone] - FAX: [phone]   -- Appointments: No future appointments.   -- SUBJECTIVE   History of Present Illness The patient presents for a Medicare wellness exam, diabetes mellitus, hypertension, hyperlipidemia, actinic keratosis, shoulder pain, neuropathy, sleep apnea, and memory loss.  He is currently transitioning to Medicare and is in the process of obtaining life insurance through [facility]. He has undergone blood tests and completed questionnaires but has not yet received the results. His last A1c test was conducted in 04/2023. He is considering an increase in his Ozempic dosage to 1 mg. He has experienced a weight loss of approximately 10 pounds and does not wish to lose further weight as previous use of Trulicity resulted in a decrease to 158 pounds with significant muscle loss. He is not currently engaged in any physical exercise. He is on Ozempic 0.5 mg, which he reports as effective, although he is uncertain if his A1c levels will accurately reflect this. He experienced mild nausea at the onset of treatment, but it has since subsided. He continues to take metformin, Actos, and glipizide.  He is on amlodipine and benazepril.  He is on baby aspirin  every other day and atorvastatin .  He is under the care  of Dr. Elmore for actinic keratoses. He had a large hump removed from his back, which was painless and non-cancerous.  He reports an improvement in his shoulder condition and is considering resuming moderate weightlifting to assess for any associated  pain. He notes an increased range of motion compared to previous limitations. He attributes this to weight loss following Trulicity treatment.  He is interested in understanding the symptoms of neuropathy and its potential impact on balance. He is not experiencing any symptoms of neuropathy.  He is contemplating undergoing a sleep study as he has been informed that many individuals are using CPAP machines. He reports inadequate sleep duration.  He expresses concern about potential side effects of the shingles vaccine.  He is experiencing forgetfulness and is questioning if this could be age-related.  He had basal cell skin cancer removed in 09/2023. He occasionally experiences coughing but does not consider it excessive.   -- ROS:  As noted above.   -- OBJECTIVE   Vitals <redacted file path>: BP 122/79   Pulse 81   Temp 97.9 F (36.6 C)   Ht 1.778 m (5' 10)   Wt 79.7 kg (175 lb 9.6 oz)   SpO2 98%   BMI 25.20 kg/m   Physical Exam Vitals and nursing note reviewed.  Constitutional:      General: He is not in acute distress.    Appearance: Normal appearance. He is not ill-appearing.  HENT:     Head: Normocephalic and atraumatic.     Right Ear: Tympanic membrane, ear canal and external ear normal.     Left Ear: Tympanic membrane, ear canal and external ear normal.     Nose: Nose normal.     Mouth/Throat:     Mouth: Mucous membranes are moist.     Pharynx: Oropharynx is clear.  Eyes:     Extraocular Movements: Extraocular movements intact.     Conjunctiva/sclera: Conjunctivae normal.     Pupils: Pupils are equal, round, and reactive to light.  Cardiovascular:     Rate and Rhythm: Normal rate and regular rhythm.     Pulses: Normal  pulses.          Dorsalis pedis pulses are 2+ on the right side and 2+ on the left side.       Posterior tibial pulses are 2+ on the right side and 2+ on the left side.     Heart sounds: No murmur heard.    No friction rub. No gallop.  Pulmonary:     Effort: Pulmonary effort is normal. No respiratory distress.     Breath sounds: Normal breath sounds.  Abdominal:     General: Abdomen is flat. There is no distension.     Palpations: Abdomen is soft. There is no mass.     Tenderness: There is no abdominal tenderness.  Genitourinary:    Comments: Deferred Musculoskeletal:        General: Normal range of motion.     Cervical back: Normal range of motion and neck supple.  Feet:     Right foot:     Protective Sensation: 10 sites tested.  10 sites sensed.     Skin integrity: Skin integrity normal.     Toenail Condition: Right toenails are normal.     Left foot:     Protective Sensation: 10 sites tested.  10 sites sensed.     Skin integrity: Skin integrity normal.     Toenail Condition: Left toenails are normal.  Skin:    General: Skin is warm and dry.     Capillary Refill: Capillary refill takes less than 2 seconds.  Neurological:     General: No focal deficit present.     Mental Status: He is alert  and oriented to person, place, and time.  Psychiatric:        Mood and Affect: Mood normal.   LATEST LABS: <redacted file path>  Lab Results  Component Value Date   WBC 6.40 04/13/2023   HGB 14.6 04/13/2023   PLT 228 04/13/2023   NA 139 04/13/2023   K 4.1 04/13/2023   CL 105 04/13/2023   CO2 29 04/13/2023   BUN 13 04/13/2023   CREATININE 0.85 04/13/2023   EGFR >90 04/13/2023   CALCIUM  9.2 04/13/2023   GLUCOSE 180 (H) 04/13/2023   PROT 6.7 04/13/2023   ALBUMIN 4.1 04/13/2023   BILITOT 0.6 04/13/2023   ALP 58 04/13/2023   ALT 13 04/13/2023   AST 12 (L) 04/13/2023   TSH 2.302 11/09/2022   CHOL 131 04/13/2023   HDL 49 (L) 04/13/2023   LDLCALC 65 04/13/2023   TRIG 85  04/13/2023   PSA 1.09 11/09/2022   Lab Results  Component Value Date   HGBA1C 9.0 (H) 04/13/2023   VITD 30.2 01/13/2023   VITAMINB12 195 10/06/2022   SEDRATE 17 11/09/2022   COLORU Yellow 01/13/2023   SPECGRAV 1.018 01/13/2023   PHUR 5.5 01/13/2023   NITRITE Negative 01/13/2023   LEUKOUA Negative 01/13/2023   PROTEINUA Negative 01/13/2023   BLOODU Negative 01/13/2023   TESTOSTERONE 353 11/09/2022   The 10-year ASCVD risk score (Arnett DK, et al., 2019) is: 25.7%   Values used to calculate the score:     Age: 34 years     Sex: Male     Is Non-Hispanic African American: No     Diabetic: Yes     Tobacco smoker: Yes     Systolic Blood Pressure: 122 mmHg     Is BP treated: Yes     HDL Cholesterol: 49 mg/dL     Total Cholesterol: 131 mg/dL   -- MEDICATIONS  Current Outpatient Medications  Medication Instructions  . amLODIPine-benazepril (LOTREL 5-40) 5-40 mg per capsule 1 capsule, oral, Daily  . aspirin  81 mg, Daily  . atorvastatin  (LIPITOR ) 80 mg, oral, Nightly  . chlorhexidine (HIBICLENS) 4 % external liquid topical, Daily PRN  . clindamycin (CLEOCIN T) 1 % solution topical, 2 times daily  . dextroamphetamine-amphetamine (ADDERALL XR) 20 mg 24 hr capsule 40 mg, oral, Every morning  . esomeprazole (NEXIUM) 20 mg, Every morning before breakfast  . glipiZIDE (GLUCOTROL XL) 2.5 mg, oral, Daily  . glucose blood (Accu-Chek Guide test strips) test strip USE TWICE DAILY AS DIRECTED  . glucose monitoring kit kit Use as instructed to check glucose  . metFORMIN (GLUCOPHAGE-XR) 1,000 mg, oral, 2 times daily with meals  . ondansetron  (ZOFRAN -ODT) 4 mg, oral, Every 8 hours PRN  . Ozempic 1 mg, subcutaneous, Every 7 days  . pioglitazone (ACTOS) 45 mg, oral, Daily  . tadalafiL (CIALIS) 5 mg, oral, Daily PRN  . tirbanibulin (Klisyri) 1 % oipk Apply thin layer to scalp once daily for 5 days.      -- MEDICAL HISTORY (resolved)  Past Medical History:  Diagnosis Date  . ADHD (attention  deficit hyperactivity disorder)   . Allergy   . Basal cell carcinoma   . Diabetes mellitus    (CMD)   . GERD (gastroesophageal reflux disease)   . Hypertension       -- SURGICAL HISTORY  Past Surgical History:  Procedure Laterality Date  . CARTILAGE SURGERY Right 1979   Procedure: CARTILAGE SURGERY; right knee   . THYROIDECTOMY, PARTIAL  1980   Procedure:  THYROIDECTOMY, PARTIAL      -- ALLERGIES  No Known Allergies     -- HEALTH MAINTENANCE  Health Maintenance Due  Topic Date Due  . Diabetic Eye Exam  03/24/2023  . Zoster (Shingles) Vaccine (2 of 2) 07/03/2023  . Hemoglobin A1C  07/14/2023  . Abdominal Aortic Aneurysm (AAA) screening  Never done  . Kidney function - Creatinine Ratio  01/13/2024      -- IMMUNIZATIONS <redacted file path>  Immunization History  Administered Date(s) Administered  . Covid-19, Mrna, Lnp-s, Pf, Tris-sucrose, 30 Mcg/0.3 Ml 04/10/2023  . Influenza, Injectable, Quadrivalent, Preservative Free 04/01/2020, 05/05/2021, 04/05/2022  . Pfizer SARS-CoV-2 Bivalent 12+ yrs 05/24/2021  . Pfizer SARS-CoV-2 Primary Series 12+ yrs 09/12/2019, 10/03/2019, 07/18/2020  . Pneumococcal Conjugate Vaccine 20-Valent (PREVNAR-20) 6 wks+ 04/13/2023  . RSV, PF (AREXVY) 60Y+ 03/17/2023  . TDAP VACCINE (BOOSTRIX,ADACEL) 7Y+ 09/06/2020  . Varicella Zoster [facility]) 18Y+ 05/08/2023  . Zoster, Unspecified 06/11/2020  . mdfluenza, MDCK, trivalent, PF 03/17/2023      -- SOCIAL HISTORY  Social History   Tobacco Use  . Smoking status: Some Days    Types: Cigars  . Smokeless tobacco: Never  . Tobacco comments:    Smoke a cigar or a pipe occasionally. Couple times a month  Vaping Use  . Vaping status: Never Used  Substance Use Topics  . Alcohol use: Yes    Alcohol/week: 5.0 standard drinks of alcohol    Types: 2 Glasses of wine, 3 Cans of beer per week  . Drug use: Never      -- FAMILY HISTORY  Family History  Problem Relation Name Age of Onset  . Stroke  Father Prashant Glosser Sr.   SABRA Heart disease Father Ifeanyi Mickelson Sr.   SABRA Hypertension Father Whitten Andreoni Sr.   . Diabetes Mother Rodrigo Mcgranahan   . Hearing loss Mother Pilar Corrales   . Hypertension Mother Tahsin Benyo   . Alcohol abuse Brother Michaela Shankel       I have personally spent 45 minutes involved in face-to-face and non-face-to-face activities for this patient on the day of the visit.  Professional time spent includes the following activities, in addition to those noted in the documentation: chart review, patient education, discussion of symptom/condition management and treatment options.     now part of    [facility], 3rd floor [address] [place], [place]  [address] Tel:  [phone]   Fax: [phone]   Medicare Wellness Visit Type:: Subsequent Annual Wellness Visit  Name: Charles Burton  Age: 63 y.o. Date of Birth: [birth year] MRN: [id number]  Visit Date: 11/10/2023  History obtained from: patient Living Arrangements/Support System/Health Assessment/Pain/Stress Marital status: divorced Number of children: 2 Occupation: (Patient-Rptd) (P) Pastor Living arrangements: lives with family Does the patient have a support system (family, friend, church, Conservation officer, nature, etc)?: Yes Patient rates overall health status as: (Patient-Rptd) (P) very good Do you have any dental concerns?: No In the past month, have you experienced a change in your bladder control?: No   Do you have any difficulty obtaining your medications?: No   Do you have trouble consistently taking or remembering to take all of your medications as prescribed?: No Patient rates overall stress level as: None Does stress affect daily life?: No Typical amount of pain: none Does pain affect daily life?: No Are you currently prescribed opioids?: No     Depression Screening Today's PHQ-2 results:   Today's PHQ-9 result:   PHQ-9 Question #  9   Interpretation: PHQ-2  Interpretation: Negative (None-minimal Depression Severity) (11/10/23 1502) PHQ-9 Interpretation: Negative (None-minimal Depression Severity) (11/10/23 1502)  Depression Plan: Normal/Negative Screening  Social History (Tobacco/Drugs/Sexual Activity) Mariano reports that he has been smoking cigars. He has never used smokeless tobacco. Tobacco Use?: No How many times in the past year have you used a recreational drug or used a prescription medication for nonmedical reasons?: None Risk factors for sexually transmitted infections (i.e., multiple sexual partners): No Are you bothered by sexual problems?: No Alcohol Screening How often do you have a drink containing alcohol?: Never How many standard drinks containing alcohol do you have on a typical day?: Never, 1 or 2 drinks How often do you have six or more drinks on one occasion?: Never Audit-C Score: 0 Physical Activity Regular exercise?: Yes Exercise frequency (times per week): 3 Exercise intensity: light (like slow walking) Diet How many meals a day?: 3 Eats fruit and vegetables daily?: Yes Most meals are obtained by: preparing their own meals Home and Transportation Safety All rugs have non-skid backing?: Yes All stairs or steps have railings?: N/A, no stairs Grab bars in the bathtub or shower?: Yes Have non-skid surface in bathtub or shower?: Yes Good home lighting?: Yes Regular seat belt use?: Yes     Activities of Daily Living Feed self?: Yes Bathe self?: Yes Dress self?: Yes Use toilet without assistance?: Yes Walk without assistance?: Yes   Instrumental Activities of Daily Living Manage finances?: Yes Shop for themselves?: Yes Prepare meals?: Yes Use the telephone?: Yes Manage medications?: Yes   Performs basic housework/laundry?: Yes Drives?: Yes Primary transportation is: driving Hearing Concerns about hearing?: No Uses hearing aids?: No Hear whispered voice? (Observed): Yes Vision Concerns about vision?:  No Vision exam performed?: Yes Fall Risk Is the patient ambulatory?: Yes One or more falls in the last year:: No Feels unsteady when walking:: No Cognitive Assessment Has a diagnosis of dementia or cognitive impairment?: No Are there any memory concerns by the patient, others, or providers?: No Advance Directives Living will?: (!) No Advance directive information provided to patient: Yes Healthcare POA?: (!) No       Who is your in case of emergency contact?: Ragan Peaden Relationship to patient: Adult child Emergency contact's phone number: [phone]  Other History I reviewed and updated the following risk factors and conditions as appropriate: Reviewed/Updated: Problem List, Medical History, Surgical History, Family History, Medications, Allergies Reviewed/Updated: Vital Signs (height, weight, and BP), Immunizations, Health Maintenance Patient Care Team Updated: Done  Vital Signs:  BP 122/79   Pulse 81   Temp 97.9 F (36.6 C)   Ht 1.778 m (5' 10)   Wt 79.7 kg (175 lb 9.6 oz)   SpO2 98%   BMI 25.20 kg/m   Screening and Immunizations: Health Maintenance Status       Date Due Completion Dates   Medicare Annual Wellness (AWV) Initial Visit Never done ---   Medicare Annual Wellness (AWV) Subsequent Visits Never done ---   Hepatitis C Screening Never done ---   Diabetes: Retinopathy Screening Combo 03/24/2023 03/23/2022 (Done), 07/30/2020 (Done)   Comment on 03/23/2022: See Legacy System   Comment on 07/30/2020: See Legacy System   ZOSTER VACCINE (2 of 2) 07/03/2023 05/08/2023, 06/11/2020   Abdominal Aortic Aneurysm (AAA) Screening Never done ---   COVID-19 Vaccine (6 - 2024-25 season) 03/04/2024 04/10/2023, 05/24/2021   Influenza Vaccine (1) 02/02/2024 03/17/2023, 04/05/2022   Diabetes:  eGFR for Kidney Evaluation 04/12/2024 04/13/2023, 01/13/2023  Diabetes: Hemoglobin A1C 05/28/2024 02/26/2024, 02/26/2024   Comprehensive Annual Visit 11/09/2024 11/10/2023, 01/11/2023   Diabetes:  Foot Exam 11/09/2024 11/10/2023, 11/10/2023   Comment on 01/13/2022: See Legacy System   Comment on 01/13/2021: See Legacy System   Comment on 12/18/2019: See Legacy System   Diabetes:  Quantitative uACR for Kidney Evaluation 12/13/2024 12/14/2023   Depression Screening 03/07/2025 03/07/2024   Colorectal Cancer Screening 05/25/2026 05/26/2023   DTaP/Tdap/Td Vaccines (2 - Td or Tdap) 09/07/2030 09/06/2020       Assessment/Plan: Subsequent Annual Wellness Visit: The topics above were reviewed with the patient.  Healthy lifestyle principles reviewed.  Recommendations provided when indicated.  Follow up 1 year for next wellness visit. Orders Placed This Encounter  Procedures  . CBC with Differential  . Comprehensive Metabolic Panel  . Lipid Panel  . TSH With Reflex To Free T4  . Hemoglobin A1C With Estimated Average Glucose  . Albumin, Random Urine  . Vitamin B12  . PSA, Total (Screen)   New Medications Ordered This Visit  Medications  . amLODIPine-benazepril (LOTREL 5-40) 5-40 mg per capsule    Sig: Take one capsule by mouth daily.    Dispense:  90 capsule    Refill:  1    Take 1 capsule by mouth daily.  . atorvastatin  (LIPITOR ) 80 mg tablet    Sig: Take one tablet (80 mg total) by mouth nightly.    Dispense:  90 tablet    Refill:  3    Take 1 tablet (80 mg total) by mouth nightly.  SABRA glipiZIDE (GLUCOTROL XL) 2.5 mg 24 hr tablet    Sig: Take one tablet (2.5 mg total) by mouth daily.    Dispense:  90 tablet    Refill:  1    Take 1 tablet (2.5 mg total) by mouth daily.  . metFORMIN (GLUCOPHAGE-XR) 500 mg 24 hr tablet    Sig: Take two tablets (1,000 mg total) by mouth in the morning and two tablets (1,000 mg total) in the evening. Take with meals.    Dispense:  360 tablet    Refill:  1    Take 2 tablets (1,000 mg total) by mouth 2 times daily with meals.  . pioglitazone (ACTOS) 45 mg tablet    Sig: Take one tablet (45 mg total) by mouth daily.    Dispense:  90 tablet    Refill:  1     Take 1 tablet (45 mg total) by mouth daily.  . tadalafiL (CIALIS) 5 mg tablet    Sig: Take one tablet (5 mg total) by mouth daily as needed for erectile dysfunction.    Dispense:  30 tablet    Refill:  11  . tirbanibulin (Klisyri) 1 % oipk    Sig: Apply thin layer to scalp once daily for 5 days.    Dispense:  5 packet    Refill:  1    Patient Care Team: Bernardino Darel Dec, MD as PCP - Attributed  Electronically signed by: Bernardino Darel Dec, MD 11/10/2023 3:10 PM

## 2024-01-05 ENCOUNTER — Observation Stay (HOSPITAL_COMMUNITY)

## 2024-01-05 ENCOUNTER — Encounter (HOSPITAL_COMMUNITY): Payer: Self-pay

## 2024-01-05 ENCOUNTER — Emergency Department (HOSPITAL_COMMUNITY)

## 2024-01-05 ENCOUNTER — Encounter (HOSPITAL_COMMUNITY): Payer: Self-pay | Admitting: Emergency Medicine

## 2024-01-05 ENCOUNTER — Other Ambulatory Visit: Payer: Self-pay

## 2024-01-05 ENCOUNTER — Ambulatory Visit (HOSPITAL_COMMUNITY)
Admission: EM | Admit: 2024-01-05 | Discharge: 2024-01-05 | Disposition: A | Discharge status: Home / Self Care | Attending: Physician Assistant | Admitting: Physician Assistant

## 2024-01-05 ENCOUNTER — Inpatient Hospital Stay (HOSPITAL_COMMUNITY)
Admission: EM | Admit: 2024-01-05 | Discharge: 2024-01-08 | DRG: 042 | Disposition: A | Discharge status: Home / Self Care | Attending: Internal Medicine | Admitting: Internal Medicine

## 2024-01-05 DIAGNOSIS — E1140 Type 2 diabetes mellitus with diabetic neuropathy, unspecified: Secondary | ICD-10-CM | POA: Diagnosis present

## 2024-01-05 DIAGNOSIS — R519 Headache, unspecified: Secondary | ICD-10-CM | POA: Diagnosis not present

## 2024-01-05 DIAGNOSIS — G459 Transient cerebral ischemic attack, unspecified: Secondary | ICD-10-CM

## 2024-01-05 DIAGNOSIS — F17290 Nicotine dependence, other tobacco product, uncomplicated: Secondary | ICD-10-CM | POA: Diagnosis present

## 2024-01-05 DIAGNOSIS — R4781 Slurred speech: Secondary | ICD-10-CM

## 2024-01-05 DIAGNOSIS — I6381 Other cerebral infarction due to occlusion or stenosis of small artery: Principal | ICD-10-CM | POA: Diagnosis present

## 2024-01-05 DIAGNOSIS — E785 Hyperlipidemia, unspecified: Secondary | ICD-10-CM | POA: Diagnosis present

## 2024-01-05 DIAGNOSIS — K219 Gastro-esophageal reflux disease without esophagitis: Secondary | ICD-10-CM | POA: Diagnosis present

## 2024-01-05 DIAGNOSIS — I161 Hypertensive emergency: Secondary | ICD-10-CM | POA: Diagnosis not present

## 2024-01-05 DIAGNOSIS — I10 Essential (primary) hypertension: Secondary | ICD-10-CM | POA: Diagnosis present

## 2024-01-05 DIAGNOSIS — R29810 Facial weakness: Secondary | ICD-10-CM | POA: Diagnosis present

## 2024-01-05 DIAGNOSIS — Z7902 Long term (current) use of antithrombotics/antiplatelets: Secondary | ICD-10-CM

## 2024-01-05 DIAGNOSIS — R2990 Unspecified symptoms and signs involving the nervous system: Principal | ICD-10-CM | POA: Diagnosis present

## 2024-01-05 DIAGNOSIS — Z7984 Long term (current) use of oral hypoglycemic drugs: Secondary | ICD-10-CM

## 2024-01-05 DIAGNOSIS — G4733 Obstructive sleep apnea (adult) (pediatric): Secondary | ICD-10-CM | POA: Diagnosis present

## 2024-01-05 DIAGNOSIS — E890 Postprocedural hypothyroidism: Secondary | ICD-10-CM | POA: Diagnosis present

## 2024-01-05 DIAGNOSIS — Z7982 Long term (current) use of aspirin: Secondary | ICD-10-CM

## 2024-01-05 DIAGNOSIS — Z79899 Other long term (current) drug therapy: Secondary | ICD-10-CM

## 2024-01-05 DIAGNOSIS — E1165 Type 2 diabetes mellitus with hyperglycemia: Secondary | ICD-10-CM | POA: Diagnosis present

## 2024-01-05 DIAGNOSIS — Z794 Long term (current) use of insulin: Secondary | ICD-10-CM

## 2024-01-05 DIAGNOSIS — R29702 NIHSS score 2: Secondary | ICD-10-CM | POA: Diagnosis present

## 2024-01-05 DIAGNOSIS — R4701 Aphasia: Secondary | ICD-10-CM | POA: Diagnosis present

## 2024-01-05 DIAGNOSIS — I639 Cerebral infarction, unspecified: Secondary | ICD-10-CM

## 2024-01-05 DIAGNOSIS — R471 Dysarthria and anarthria: Secondary | ICD-10-CM | POA: Diagnosis present

## 2024-01-05 DIAGNOSIS — R4702 Dysphasia: Secondary | ICD-10-CM | POA: Diagnosis present

## 2024-01-05 LAB — COMPREHENSIVE METABOLIC PANEL WITH GFR
ALT: 18 U/L (ref 0–44)
AST: 20 U/L (ref 15–41)
Albumin: 3.6 g/dL (ref 3.5–5.0)
Alkaline Phosphatase: 64 U/L (ref 38–126)
Anion gap: 12 (ref 5–15)
BUN: 13 mg/dL (ref 8–23)
CO2: 24 mmol/L (ref 22–32)
Calcium: 9.1 mg/dL (ref 8.9–10.3)
Chloride: 103 mmol/L (ref 98–111)
Creatinine, Ser: 0.97 mg/dL (ref 0.61–1.24)
GFR, Estimated: >60 mL/min (ref >60)
Glucose, Bld: 227 mg/dL — ABNORMAL HIGH (ref 70–99)
Potassium: 3.8 mmol/L (ref 3.5–5.1)
Sodium: 139 mmol/L (ref 135–145)
Total Bilirubin: 0.8 mg/dL (ref 0.0–1.2)
Total Protein: 7 g/dL (ref 6.5–8.1)

## 2024-01-05 LAB — APTT: aPTT: 42 s — ABNORMAL HIGH (ref 24–36)

## 2024-01-05 LAB — DIFFERENTIAL
Abs Immature Granulocytes: 0.04 K/uL (ref 0.00–0.07)
Basophils Absolute: 0.1 K/uL (ref 0.0–0.1)
Basophils Relative: 1 %
Eosinophils Absolute: 0.2 K/uL (ref 0.0–0.5)
Eosinophils Relative: 3 %
Immature Granulocytes: 1 %
Lymphocytes Relative: 19 %
Lymphs Abs: 1.5 K/uL (ref 0.7–4.0)
Monocytes Absolute: 0.7 K/uL (ref 0.1–1.0)
Monocytes Relative: 9 %
Neutro Abs: 5.4 K/uL (ref 1.7–7.7)
Neutrophils Relative %: 67 %

## 2024-01-05 LAB — POCT FASTING CBG KUC MANUAL ENTRY: POCT Glucose (KUC): 238 mg/dL — AB (ref 70–99)

## 2024-01-05 LAB — CBC
HCT: 46.2 % (ref 39.0–52.0)
Hemoglobin: 15.6 g/dL (ref 13.0–17.0)
MCH: 28.7 pg (ref 26.0–34.0)
MCHC: 33.8 g/dL (ref 30.0–36.0)
MCV: 85.1 fL (ref 80.0–100.0)
Platelets: 221 K/uL (ref 150–400)
RBC: 5.43 MIL/uL (ref 4.22–5.81)
RDW: 13.4 % (ref 11.5–15.5)
WBC: 8 K/uL (ref 4.0–10.5)
nRBC: 0 % (ref 0.0–0.2)

## 2024-01-05 LAB — ETHANOL: Alcohol, Ethyl (B): <15 mg/dL (ref <15)

## 2024-01-05 LAB — I-STAT CHEM 8, ED
BUN: 16 mg/dL (ref 8–23)
Calcium, Ion: 1.1 mmol/L — ABNORMAL LOW (ref 1.15–1.40)
Chloride: 102 mmol/L (ref 98–111)
Creatinine, Ser: 0.9 mg/dL (ref 0.61–1.24)
Glucose, Bld: 224 mg/dL — ABNORMAL HIGH (ref 70–99)
HCT: 48 % (ref 39.0–52.0)
Hemoglobin: 16.3 g/dL (ref 13.0–17.0)
Potassium: 3.8 mmol/L (ref 3.5–5.1)
Sodium: 138 mmol/L (ref 135–145)
TCO2: 25 mmol/L (ref 22–32)

## 2024-01-05 LAB — PROTIME-INR
INR: 1 (ref 0.8–1.2)
Prothrombin Time: 13.6 s (ref 11.4–15.2)

## 2024-01-05 LAB — GLUCOSE, CAPILLARY: Glucose-Capillary: 163 mg/dL — ABNORMAL HIGH (ref 70–99)

## 2024-01-05 LAB — CBG MONITORING, ED: Glucose-Capillary: 227 mg/dL — ABNORMAL HIGH (ref 70–99)

## 2024-01-05 MED ORDER — ENOXAPARIN SODIUM 40 MG/0.4ML IJ SOSY
40.0000 mg | PREFILLED_SYRINGE | INTRAMUSCULAR | Status: DC
  Administered 2024-01-05 – 2024-01-07 (×3): 40 mg via SUBCUTANEOUS
  Filled 2024-01-05 (×3): qty 0.4

## 2024-01-05 MED ORDER — MELATONIN 3 MG PO TABS: 6.0000 mg | ORAL_TABLET | Freq: Every evening | ORAL | Status: DC | PRN

## 2024-01-05 MED ORDER — ASPIRIN 81 MG PO TBEC
81.0000 mg | DELAYED_RELEASE_TABLET | Freq: Every day | ORAL | Status: DC
  Administered 2024-01-06 – 2024-01-08 (×3): 81 mg via ORAL
  Filled 2024-01-05 (×3): qty 1

## 2024-01-05 MED ORDER — INSULIN ASPART 100 UNIT/ML IJ SOLN
0.0000 [IU] | Freq: Three times a day (TID) | INTRAMUSCULAR | Status: DC
  Administered 2024-01-06 (×3): 1 [IU] via SUBCUTANEOUS
  Administered 2024-01-07: 2 [IU] via SUBCUTANEOUS
  Administered 2024-01-07: 1 [IU] via SUBCUTANEOUS
  Administered 2024-01-07: 4 [IU] via SUBCUTANEOUS
  Administered 2024-01-08: 3 [IU] via SUBCUTANEOUS

## 2024-01-05 MED ORDER — ACETAMINOPHEN 500 MG PO TABS
1000.0000 mg | ORAL_TABLET | Freq: Four times a day (QID) | ORAL | Status: DC | PRN
  Administered 2024-01-06 – 2024-01-08 (×4): 1000 mg via ORAL
  Filled 2024-01-05 (×4): qty 2

## 2024-01-05 MED ORDER — ALBUTEROL SULFATE (2.5 MG/3ML) 0.083% IN NEBU: 2.5000 mg | INHALATION_SOLUTION | RESPIRATORY_TRACT | Status: DC | PRN

## 2024-01-05 MED ORDER — SODIUM CHLORIDE 0.9 % IV BOLUS
1000.0000 mL | Freq: Once | INTRAVENOUS | Status: AC
  Administered 2024-01-05: 1000 mL via INTRAVENOUS

## 2024-01-05 MED ORDER — INSULIN ASPART 100 UNIT/ML IJ SOLN
0.0000 [IU] | Freq: Every day | INTRAMUSCULAR | Status: DC
  Administered 2024-01-07: 3 [IU] via SUBCUTANEOUS

## 2024-01-05 MED ORDER — ATORVASTATIN CALCIUM 80 MG PO TABS
80.0000 mg | ORAL_TABLET | Freq: Every day | ORAL | Status: DC
  Administered 2024-01-05 – 2024-01-07 (×3): 80 mg via ORAL
  Filled 2024-01-05 (×3): qty 1

## 2024-01-05 MED ORDER — POLYETHYLENE GLYCOL 3350 17 G PO PACK: 17.0000 g | PACK | Freq: Every day | ORAL | Status: DC | PRN

## 2024-01-05 MED ORDER — IOHEXOL 350 MG/ML SOLN
75.0000 mL | Freq: Once | INTRAVENOUS | Status: AC | PRN
Start: 2024-01-05 — End: 2024-01-05
  Administered 2024-01-05: 75 mL via INTRAVENOUS

## 2024-01-05 MED ORDER — SODIUM CHLORIDE 0.9% FLUSH
3.0000 mL | Freq: Two times a day (BID) | INTRAVENOUS | Status: DC
  Administered 2024-01-05 – 2024-01-08 (×6): 3 mL via INTRAVENOUS

## 2024-01-05 MED ORDER — ATORVASTATIN CALCIUM 40 MG PO TABS: 40.0000 mg | ORAL_TABLET | Freq: Every day | ORAL | Status: DC

## 2024-01-05 MED ORDER — ONDANSETRON HCL 4 MG/2ML IJ SOLN: 4.0000 mg | Freq: Four times a day (QID) | INTRAMUSCULAR | Status: DC | PRN

## 2024-01-05 MED ORDER — CLOPIDOGREL BISULFATE 300 MG PO TABS
300.0000 mg | ORAL_TABLET | Freq: Once | ORAL | Status: AC
  Administered 2024-01-05: 300 mg via ORAL
  Filled 2024-01-05: qty 1

## 2024-01-05 MED ORDER — ASPIRIN 325 MG PO TABS
325.0000 mg | ORAL_TABLET | Freq: Once | ORAL | Status: AC
  Administered 2024-01-05: 325 mg via ORAL
  Filled 2024-01-05: qty 1

## 2024-01-05 MED ORDER — SODIUM CHLORIDE 0.9% FLUSH
3.0000 mL | Freq: Once | INTRAVENOUS | Status: AC
  Administered 2024-01-05: 3 mL via INTRAVENOUS

## 2024-01-05 MED ORDER — CLOPIDOGREL BISULFATE 75 MG PO TABS
75.0000 mg | ORAL_TABLET | Freq: Every day | ORAL | Status: DC
  Administered 2024-01-06 – 2024-01-08 (×3): 75 mg via ORAL
  Filled 2024-01-05 (×3): qty 1

## 2024-01-05 NOTE — H&P (Signed)
 History and Physical    Charles Burton FMW:[id number] DOB: 05/02/1961 DOA: 01/05/2024  PCP: System, Provider Not In   Patient coming from: Home   Chief Complaint:  Chief Complaint  Patient presents with   Aphasia    HPI:  Charles Burton is a 63 y.o. male with hx of diabetes type 2, hypertension, hyperlipidemia, OSA, neuropathy, who was referred to the ED by urgent care for strokelike symptoms.  Code stroke activated in the ED, see additional ED course below.  LKWT 12 PM with acute onset of dysarthria, and expressive aphasia, took a nap after onset and slept about 40 minutes. Overall symptoms lasted ~ 1 hr. Still feels that his speech is not currently 100% back to baseline at time of interview. Also had some transient numbness into the R hand. No other focal numbness or weakness, or vision changes. Also had headache. Seen at urgent care who referred to ED and symptoms had resolved prior to ED evaluation. Denies any other recent illness. Taking aspirin  81 mg daily prior to this admission.    Review of Systems:  ROS complete and negative except as marked above   No Known Allergies  Prior to Admission medications   Medication Sig Start Date End Date Taking? Authorizing Provider  amphetamine-dextroamphetamine (ADDERALL) 20 MG tablet Take 20 mg by mouth daily.    [provider]  aspirin  81 MG tablet Take 81 mg by mouth daily.    [provider]  atorvastatin  (LIPITOR ) 80 MG tablet  05/18/12   [provider]  benzonatate  (TESSALON ) 100 MG capsule Take 1 capsule (100 mg total) by mouth every 8 (eight) hours. 10/08/16   Leann Satterfield, NP  buPROPion (WELLBUTRIN XL) 300 MG 24 hr tablet Take 300 mg by mouth daily.    [provider]  clonazePAM (KLONOPIN) 0.5 MG tablet Take 0.5 mg by mouth 3 (three) times daily as needed.    [provider]  glimepiride (AMARYL) 2 MG tablet Take 2 mg by mouth daily before breakfast.    [provider]   Lancets (ONETOUCH ULTRASOFT) lancets  04/04/12   [provider]  lisinopril (PRINIVIL,ZESTRIL) 20 MG tablet Take 20 mg by mouth daily.    [provider]  ONE TOUCH ULTRA TEST test strip  04/04/12   [provider]  oseltamivir  (TAMIFLU ) 75 MG capsule Take 1 capsule (75 mg total) by mouth every 12 (twelve) hours. 10/08/16   Leann Satterfield, NP  pantoprazole (PROTONIX) 40 MG tablet Take 40 mg by mouth daily.    [provider]  sertraline (ZOLOFT) 100 MG tablet Take 100 mg by mouth daily.    [provider]  tadalafil (CIALIS) 20 MG tablet Take 20 mg by mouth daily as needed.    [provider]  Testosterone (TESTIM TD) Place onto the skin.    [provider]  triazolam (HALCION) 0.25 MG tablet Take 0.25 mg by mouth at bedtime as needed.    [provider]    Past Medical History:  Diagnosis Date   Anxiety    Diabetes mellitus without complication (HCC)    GERD (gastroesophageal reflux disease)    Hyperlipidemia    Hypertension    Kidney stones    Low testosterone     Past Surgical History:  Procedure Laterality Date   KIDNEY STONE SURGERY  2000   KNEE CARTILAGE SURGERY  1980   NOSE SURGERY  1978   THYROIDECTOMY       reports  that he has been smoking cigars. He does not have any smokeless tobacco history on file. He reports current alcohol use. He reports that he does not use drugs.  History reviewed. No pertinent family history.   Physical Exam: Vitals:   01/05/24 1915 01/05/24 1920 01/05/24 1925 01/05/24 1945  BP: (!) 181/116   (!) 181/118  Pulse: (!) 104 (!) 103 (!) 106 (!) 102  Resp:   (!) 23 13  Temp:      SpO2: 96% 100% 98% 100%  Weight:      Height:        Gen: Awake, alert, NAD   CV: Regular, normal S1, S2, no murmurs  Resp: Normal WOB, CTAB  Abd: Flat, normoactive, nontender MSK: Symmetric, no edema  Skin: No rashes or lesions to exposed skin  Neuro: Alert and interactive  Psych:  euthymic, appropriate    Data review:   Labs reviewed, notable for:   Hyperglycemic 227 Other chemistries and blood counts unremarkable  Micro:  No results found for this or any previous visit.  Imaging reviewed:  CT HEAD CODE STROKE WO CONTRAST Result Date: 01/05/2024 CLINICAL DATA:  Code stroke.  Neuro deficit, concern for stroke. EXAM: CT HEAD WITHOUT CONTRAST TECHNIQUE: Contiguous axial images were obtained from the base of the skull through the vertex without intravenous contrast. RADIATION DOSE REDUCTION: This exam was performed according to the departmental dose-optimization program which includes automated exposure control, adjustment of the mA and/or kV according to patient size and/or use of iterative reconstruction technique. COMPARISON:  None Available. FINDINGS: Brain: No acute intracranial hemorrhage. No CT evidence of acute infarct. Nonspecific hypoattenuation in the periventricular and subcortical white matter favored to reflect chronic microvascular ischemic changes. No edema, mass effect, or midline shift. The basilar cisterns are patent. Ventricles: The ventricles are normal. Vascular: Atherosclerotic calcifications of the carotid siphons. No hyperdense vessel. Skull: No acute or aggressive finding. Orbits: Orbits are symmetric. Sinuses: Mucosal thickening in the right maxillary sinus. Additional mild mucosal thickening in the ethmoid sinuses, right greater than left. Other: Mastoid air cells are clear. ASPECTS [facility] Stroke Program Early CT Score) - Ganglionic level infarction (caudate, lentiform nuclei, internal capsule, insula, M1-M3 cortex): 7 - Supraganglionic infarction (M4-M6 cortex): 3 Total score (0-10 with 10 being normal): 10 IMPRESSION: 1. No CT evidence of acute intracranial abnormality. 2. Chronic microvascular ischemic changes. 3. ASPECTS is 10 These results were communicated to Dr. Vanessa at 7:05 pm on 01/05/2024 by text page via the [facility] messaging system.  Electronically Signed   By: Donnice Mania M.D.   On: 01/05/2024 19:06    EKG:  Personally reviewed sinus rhythm, PRWP, possible slight ST depression in aVL, T wave inversion III   ED Course:  Code stroke activated in the ED. seen by neurology.  CT head negative.  Not TNK candidate due to resolution of symptoms.    Assessment/Plan:  63 y.o. male with hx diabetes type 2, hypertension, hyperlipidemia, OSA, neuropathy, who was referred to the ED by urgent care for strokelike symptoms, had presented with dysarthria, expressive aphasia and headache, code stroke activated in the ED, symptoms resolved.  Brought in for observation for TIA evaluation  Strokelike symptoms, resolved, suspect TIA v complex migraine  LWKT 7/4 ~12 PM onset of dysarthria, expressive aphasia, headache. Mostly resolved within about 1 hr minutes after waking from nap. On exam no focal neurologic deficits. Lab eval unremarkable. CT Head negative. Not TNK or thrombectomy candidate due to resolved symptoms.  - Neurology  consulted, appreciate recommendations - CTA head and neck for vessel imaging, MRI brain without contrast  - Antiplatelet aspirin  325 mg load, then 81 mg daily and Plavix  300 mg load then 75 mg daily; to continue short term DAPT x 21 days then aspirin  monotherapy ; ABCD2 = 4  - Statin continue atorvastatin  80 mg daily   - Permissive HTN <220/120 x 24 hr  - Check lipids and A1c - Serial neurochecks  - Telemonitoring - TTE with bubble - PT/OT/SLP - DVT prophylaxis per below - Swallow screen then [facility] diet    Chronic medical problems: Diabetes type 2: Check A1c, home regimen metformin, glipizide, pioglitazone, Ozempic. SSI with hs correction inpatient.  Hypertension: Permissive HTN x 24 hr. holding home amlodipine-benazepril Hyperlipidemia: See stroke like symptoms above  OSA: does not use   Neuropathy: Noted   Body mass index is 27.26 kg/m.    DVT prophylaxis:  Lovenox  Code Status:  Full Code Diet:   Diet Orders (From admission, onward)     Start     Ordered   01/05/24 1841  Diet NPO time specified  Diet effective now        01/05/24 1842           Family Communication:  Yes discussed with daughter at bedside   Consults: Neurology Admission status:   Observation, Telemetry bed  Severity of Illness: The appropriate patient status for this patient is OBSERVATION. Observation status is judged to be reasonable and necessary in order to provide the required intensity of service to ensure the patient's safety. The patient's presenting symptoms, physical exam findings, and initial radiographic and laboratory data in the context of their medical condition is felt to place them at decreased risk for further clinical deterioration. Furthermore, it is anticipated that the patient will be medically stable for discharge from the hospital within 2 midnights of admission.    Dorn Dawson, MD [facility]  How to contact the [facility] Attending or Consulting provider 7A - 7P or covering provider during after hours 7P -7A, for this patient.  Check the care team in [facility] and look for a) attending/consulting [facility] provider listed and b) the [facility] team listed Log into [email or web address] and use [facility] universal password to access. If you do not have the password, please contact the hospital operator. Locate the [facility] provider you are looking for under [facility] and page to a number that you can be directly reached. If you still have difficulty reaching the provider, please page the [facility] (Director on Call) for the Hospitalists listed on amion for assistance.  01/05/2024, 7:58 PM

## 2024-01-05 NOTE — Code Documentation (Signed)
 Stroke Response Nurse Documentation Code Documentation  Charles Burton is a 63 y.o. male arriving to [place]  via Private Vehicle on 01/05/2024 with past medical hx of HLD, DM On aspirin  81 mg daily. Code stroke was activated by ED.   Patient from Urgent care where he was LKW at 1300 and now complaining off headache difficulty speaking, which occurred this afternoon. He sought help at Urgent Care, and was referred here.  Stroke team at the bedside upon activation. Labs drawn and patient cleared for CT by Dr. Franklyn. Patient met by stroke team in CT. NIHSS 0, see documentation for details and code stroke times. Patient with no deficits on exam. The following imaging was completed:  CT Head. Patient is not a candidate for IV Thrombolytic due to OOW, resolution of symptoms. Patient is not a candidate for IR due to resolution of symptoms.   Care Plan: BP NIHSS q 2 for 12 hours, then q 4. REACTIVATE code stroke should aphasia reoccur.     Bedside handoff with ED RN Annitta.    Charles Burton  Stroke Response RN

## 2024-01-05 NOTE — ED Notes (Signed)
 Patient is being discharged from the Urgent Care and sent to the Emergency Department via POV . Per Rocky Gilford, patient is in need of higher level of care due to slurred speech r/o stroke. Patient is aware and verbalizes understanding of plan of care.  Vitals:   01/05/24 1819  BP: (!) 180/100  Pulse: (!) 106  Resp: 16  Temp: 98.7 F (37.1 C)  SpO2: 95%

## 2024-01-05 NOTE — Consult Note (Signed)
 NEUROLOGY CONSULT NOTE   Date of service: January 05, 2024 Patient Name: Charles Burton MRN:  [id number] DOB:  1961-02-23 Chief Complaint: aphasia, slurred speech Requesting Provider: Franklyn Sid SAILOR, MD  History of Present Illness  Charles Burton is a 63 y.o. male with hx of DM2, GERD, HTN, HLD, nephrolithiasis who presents with word finding difficutly that started around noon. His speech was slurred and not making sense. Took a nap and when he woke up, was improved but still had symptoms. Brought in to urgent care and send to the ED but his symptoms had completely resolved prior to evaluation in the ED. Despite this, a code stroke was activated.  He was not a candidate for tnkase or thrombectomy due to no symptoms.  LKW: 1200 Modified rankin score: 0-Completely asymptomatic and back to baseline post- stroke IV Thrombolysis: not offered, no symptoms. EVT: not offered, no symptoms.  NIHSS components Score: Comment  1a Level of Conscious 0[]  1[]  2[]  3[]      1b LOC Questions 0[]  1[]  2[]       1c LOC Commands 0[]  1[]  2[]       2 Best Gaze 0[]  1[]  2[]       3 Visual 0[]  1[]  2[]  3[]      4 Facial Palsy 0[]  1[]  2[]  3[]      5a Motor Arm - left 0[]  1[]  2[]  3[]  4[]  UN[]    5b Motor Arm - Right 0[]  1[]  2[]  3[]  4[]  UN[]    6a Motor Leg - Left 0[]  1[]  2[]  3[]  4[]  UN[]    6b Motor Leg - Right 0[]  1[]  2[]  3[]  4[]  UN[]    7 Limb Ataxia 0[]  1[]  2[]  UN[]      8 Sensory 0[]  1[]  2[]  UN[]      9 Best Language 0[]  1[]  2[]  3[]      10 Dysarthria 0[]  1[]  2[]  UN[]      11 Extinct. and Inattention 0[]  1[]  2[]       TOTAL: 0      ROS  Comprehensive ROS performed and pertinent positives documented in HPI   Past History   Past Medical History:  Diagnosis Date   Anxiety    Diabetes mellitus without complication (HCC)    GERD (gastroesophageal reflux disease)    Hyperlipidemia    Hypertension    Kidney stones    Low testosterone     Past Surgical History:  Procedure Laterality Date   KIDNEY STONE SURGERY   2000   KNEE CARTILAGE SURGERY  1980   NOSE SURGERY  1978   THYROIDECTOMY      Family History: History reviewed. No pertinent family history.  Social History  reports that he has been smoking cigars. He does not have any smokeless tobacco history on file. He reports current alcohol use. He reports that he does not use drugs.  No Known Allergies  Medications  No current facility-administered medications for this encounter.  Current Outpatient Medications:    amphetamine-dextroamphetamine (ADDERALL) 20 MG tablet, Take 20 mg by mouth daily., Disp: , Rfl:    aspirin  81 MG tablet, Take 81 mg by mouth daily., Disp: , Rfl:    atorvastatin  (LIPITOR ) 80 MG tablet, , Disp: , Rfl:    benzonatate  (TESSALON ) 100 MG capsule, Take 1 capsule (100 mg total) by mouth every 8 (eight) hours., Disp: 21 capsule, Rfl: 0   buPROPion (WELLBUTRIN XL) 300 MG 24 hr tablet, Take 300 mg by mouth daily., Disp: , Rfl:    clonazePAM (KLONOPIN) 0.5 MG tablet, Take 0.5 mg by  mouth 3 (three) times daily as needed., Disp: , Rfl:    glimepiride (AMARYL) 2 MG tablet, Take 2 mg by mouth daily before breakfast., Disp: , Rfl:    Lancets (ONETOUCH ULTRASOFT) lancets, , Disp: , Rfl:    lisinopril (PRINIVIL,ZESTRIL) 20 MG tablet, Take 20 mg by mouth daily., Disp: , Rfl:    ONE TOUCH ULTRA TEST test strip, , Disp: , Rfl:    oseltamivir  (TAMIFLU ) 75 MG capsule, Take 1 capsule (75 mg total) by mouth every 12 (twelve) hours., Disp: 10 capsule, Rfl: 0   pantoprazole (PROTONIX) 40 MG tablet, Take 40 mg by mouth daily., Disp: , Rfl:    sertraline (ZOLOFT) 100 MG tablet, Take 100 mg by mouth daily., Disp: , Rfl:    tadalafil (CIALIS) 20 MG tablet, Take 20 mg by mouth daily as needed., Disp: , Rfl:    Testosterone (TESTIM TD), Place onto the skin., Disp: , Rfl:    triazolam (HALCION) 0.25 MG tablet, Take 0.25 mg by mouth at bedtime as needed., Disp: , Rfl:   Vitals   Vitals:   01/05/24 1837 01/05/24 1915 01/05/24 1920 01/05/24 1925   BP:  (!) 181/116    Pulse:  (!) 104 (!) 103 (!) 106  Resp:    (!) 23  Temp:      SpO2:  96% 100% 98%  Weight: 86.2 kg     Height: 5' 10 (1.778 m)       Body mass index is 27.26 kg/m.   Physical Exam   General: Laying comfortably in bed; in no acute distress.  HENT: Normal oropharynx and mucosa. Normal external appearance of ears and nose.  Neck: Supple, no pain or tenderness  CV: No JVD. No peripheral edema.  Pulmonary: Symmetric Chest rise. Normal respiratory effort.  Abdomen: Soft to touch, non-tender.  Ext: No cyanosis, edema, or deformity  Skin: No rash. Normal palpation of skin.   Musculoskeletal: Normal digits and nails by inspection. No clubbing.   Neurologic Examination  Mental status/Cognition: Alert, oriented to self, place, month and year, good attention.  Speech/language: Fluent, comprehension intact, object naming intact, repetition intact.  Cranial nerves:   CN II Pupils equal and reactive to light, no VF deficits    CN III,IV,VI EOM intact, no gaze preference or deviation, no nystagmus    CN V normal sensation in V1, V2, and V3 segments bilaterally    CN VII no asymmetry, no nasolabial fold flattening    CN VIII normal hearing to speech    CN IX & X normal palatal elevation, no uvular deviation    CN XI 5/5 head turn and 5/5 shoulder shrug bilaterally    CN XII midline tongue protrusion    Motor:  Muscle bulk: normal, tone normal, pronator drift none tremor none Mvmt Root Nerve  Muscle Right Left Comments  SA C5/6 Ax Deltoid 5 5   EF C5/6 Mc Biceps 5 5   EE C6/7/8 Rad Triceps 5 5   WF C6/7 Med FCR     WE C7/8 PIN ECU     F Ab C8/T1 U ADM/FDI 5 5   HF L1/2/3 Fem Illopsoas 5 5   KE L2/3/4 Fem Quad 5 5   DF L4/5 D Peron Tib Ant 5 5   PF S1/2 Tibial Grc/Sol 5 5    Sensation:  Light touch Intact throughout   Pin prick    Temperature    Vibration   Proprioception    Coordination/Complex Motor:  -  Finger to Nose intact BL - Heel to shin intact  BL - Rapid alternating movement are normal - Gait: deferred.   Labs/Imaging/Neurodiagnostic studies   CBC:  Recent Labs  Lab 01/24/24 1846 January 24, 2024 1850  WBC 8.0  --   NEUTROABS 5.4  --   HGB 15.6 16.3  HCT 46.2 48.0  MCV 85.1  --   PLT 221  --    Basic Metabolic Panel:  Lab Results  Component Value Date   NA 138 Jan 24, 2024   K 3.8 2024/01/24   GLUCOSE 224 (H) Jan 24, 2024   BUN 16 01/24/24   CREATININE 0.90 01/24/2024   Lipid Panel: No results found for: LDLCALC HgbA1c: No results found for: HGBA1C Urine Drug Screen: No results found for: LABOPIA, COCAINSCRNUR, LABBENZ, AMPHETMU, THCU, LABBARB  Alcohol Level No results found for: [facility] INR  Lab Results  Component Value Date   INR 1.0 01-24-2024   APTT  Lab Results  Component Value Date   APTT 42 (H) 2024-01-24   AED levels: No results found for: PHENYTOIN, ZONISAMIDE, LAMOTRIGINE, LEVETIRACETA  CT Head without contrast(Personally reviewed): CTH was negative for a large hypodensity concerning for a large territory infarct or hyperdensity concerning for an ICH  CT angio Head and Neck with contrast: pending  MRI Brain(Personally reviewed): Pending  ASSESSMENT   Shaul A Pontillo is a 63 y.o. male with hx of DM2, GERD, HTN, HLD, nephrolithiasis who presents with aphasia, slurred speech and headache. Symptoms have resolved. Epsiode concerning for a TIA.  RECOMMENDATIONS  - Frequent Neuro checks per stroke unit protocol - Recommend brain imaging with MRI Brain without contrast - Recommend Vascular imaging with CTA head and neck - Recommend obtaining TTE - Recommend obtaining Lipid panel with LDL - Please start statin if LDL > 70 - Recommend HbA1c to evaluate for diabetes and how well it is controlled. - Antithrombotic - aspirin  81mg  dialy along with plavix  75mg  dail x 21 days, followed by Aspirin  81mg  daily alone. - Recommend DVT ppx - SBP goal - permissive hypertension first 24 h <  220/110. Held home meds.  - Recommend Telemetry monitoring for arrythmia - Recommend bedside swallow screen prior to PO intake. - Stroke education booklet - Recommend PT/OT/SLP consult  ______________________________________________________________________    Signed, Brealyn Baril, MD [facility]

## 2024-01-05 NOTE — ED Triage Notes (Addendum)
 Pt via POV c/o headache and slurred speech since around noon today. Speech has improved and is almost back to normal; denies blurry vision/dizziness. Pt has hx HTN and is med compliant. BP 190/104 in triage. Bilateral temporal headache rated 7/10. Slight slurred speech noted in triage and pt has difficulty finding some words while explaining his symptoms. Slight right-sided facial droop noted; per pt's daughter at bedside, this is similar to prior facial appearance and she does not think it has changed or worsened recently. NIHSS 2; code stroke activation per MD during MSE.

## 2024-01-05 NOTE — ED Notes (Signed)
 Called EDP to triage for MSE.

## 2024-01-05 NOTE — ED Triage Notes (Signed)
 Patient's daughter reports that the patient had slurred speech around 1200 today. Patient was advised by his family to lay down and sleep. Daughter states the slurred speech lasted approx 1 1/2 hours. Patient states he was gibberish when he had slurred speech.  Patient c/o a headache and feeling weak/tired.   Patient also reports that he is taking antibiotics for an infected right upper tooth and is to have the tooth extracted in 5 days.

## 2024-01-05 NOTE — ED Provider Notes (Signed)
 [facility]    CSN: [id number] Arrival date & time: 01/05/24  1810      History   Chief Complaint Chief Complaint  Patient presents with   Aphasia    HPI Charles Burton is a 63 y.o. male.   Presents today accompanied by his daughter who provides majority of history.  Reports that around 12:00 PM he had slurred speech which he and family members described as speaking gibberish with unintelligible communication.  Last known normal was around 12:00 PM (6.5 hours ago).  He reported feeling tired and so went to lay down and slept for little while and when he woke up his symptoms had improved.  Since then, he has had a significant headache which is rated 6 on a 0-10 to pain scale, described as pressure, no aggravating leaving factors identified.  He has never had stroke or TIA.  He does have a history of cardiovascular risk factors including diabetes, hypertension, hyperlipidemia.  He has not taken any medication for symptoms.  He reports feeling generalized weakness but denies any focal weakness, ongoing aphasia, confusion, dizziness, visual disturbance, nausea, vomiting.    Past Medical History:  Diagnosis Date   Anxiety    Diabetes mellitus without complication (HCC)    GERD (gastroesophageal reflux disease)    Hyperlipidemia    Hypertension    Kidney stones    Low testosterone     Patient Active Problem List   Diagnosis Date Noted   Umbilical hernia 06/05/2012    Past Surgical History:  Procedure Laterality Date   KIDNEY STONE SURGERY  2000   KNEE CARTILAGE SURGERY  1980   NOSE SURGERY  1978   THYROIDECTOMY         Home Medications    Prior to Admission medications   Medication Sig Start Date End Date Taking? Authorizing Provider  amphetamine-dextroamphetamine (ADDERALL) 20 MG tablet Take 20 mg by mouth daily.    [provider]  aspirin  81 MG tablet Take 81 mg by mouth daily.    [provider]  atorvastatin  (LIPITOR ) 80 MG tablet   05/18/12   [provider]  benzonatate  (TESSALON ) 100 MG capsule Take 1 capsule (100 mg total) by mouth every 8 (eight) hours. 10/08/16   Leann Satterfield, NP  buPROPion (WELLBUTRIN XL) 300 MG 24 hr tablet Take 300 mg by mouth daily.    [provider]  clonazePAM (KLONOPIN) 0.5 MG tablet Take 0.5 mg by mouth 3 (three) times daily as needed.    [provider]  glimepiride (AMARYL) 2 MG tablet Take 2 mg by mouth daily before breakfast.    [provider]  Lancets (ONETOUCH ULTRASOFT) lancets  04/04/12   [provider]  lisinopril (PRINIVIL,ZESTRIL) 20 MG tablet Take 20 mg by mouth daily.    [provider]  ONE TOUCH ULTRA TEST test strip  04/04/12   [provider]  oseltamivir  (TAMIFLU ) 75 MG capsule Take 1 capsule (75 mg total) by mouth every 12 (twelve) hours. 10/08/16   Leann Satterfield, NP  pantoprazole (PROTONIX) 40 MG tablet Take 40 mg by mouth daily.    [provider]  sertraline (ZOLOFT) 100 MG tablet Take 100 mg by mouth daily.    [provider]  tadalafil (CIALIS) 20 MG tablet Take 20 mg by mouth daily as needed.    [provider]  Testosterone (TESTIM TD) Place onto the skin.    [provider]  triazolam (HALCION) 0.25 MG tablet Take  0.25 mg by mouth at bedtime as needed.    [provider]    Family History No family history on file.  Social History Social History   Tobacco Use   Smoking status: Some Days    Types: Cigars  Vaping Use   Vaping status: Never Used  Substance Use Topics   Alcohol use: Yes   Drug use: Never     Allergies   Patient has no known allergies.   Review of Systems Review of Systems  Constitutional:  Positive for activity change. Negative for appetite change, fatigue and fever.  Eyes:  Negative for photophobia and visual disturbance.  Gastrointestinal:  Negative for nausea and vomiting.  Neurological:  Positive for speech  difficulty, weakness (generalized) and headaches. Negative for dizziness and light-headedness.     Physical Exam Triage Vital Signs ED Triage Vitals [01/05/24 1819]  Encounter Vitals Group     BP (!) 180/100     Girls Systolic BP Percentile      Girls Diastolic BP Percentile      Boys Systolic BP Percentile      Boys Diastolic BP Percentile      Pulse Rate (!) 106     Resp 16     Temp 98.7 F (37.1 C)     Temp Source Oral     SpO2 95 %     Weight      Height      Head Circumference      Peak Flow      Pain Score 6     Pain Loc      Pain Education      Exclude from Growth Chart    No data found.  Updated Vital Signs BP (!) 180/100 (BP Location: Left Arm)   Pulse (!) 106   Temp 98.7 F (37.1 C) (Oral)   Resp 16   SpO2 95%   Visual Acuity Right Eye Distance:   Left Eye Distance:   Bilateral Distance:    Right Eye Near:   Left Eye Near:    Bilateral Near:     Physical Exam Vitals reviewed.  Constitutional:      General: He is awake.     Appearance: Normal appearance. He is well-developed. He is not ill-appearing.     Comments: Very pleasant male appears stated age in no acute distress sitting comfortably in exam room  HENT:     Head: Normocephalic and atraumatic.     Right Ear: Tympanic membrane, ear canal and external ear normal. No hemotympanum.     Left Ear: Tympanic membrane, ear canal and external ear normal. No hemotympanum.     Nose: Nose normal.     Mouth/Throat:     Tongue: Tongue does not deviate from midline.     Pharynx: Uvula midline. No oropharyngeal exudate or posterior oropharyngeal erythema.  Eyes:     Extraocular Movements: Extraocular movements intact.     Pupils: Pupils are equal, round, and reactive to light.  Cardiovascular:     Rate and Rhythm: Normal rate and regular rhythm.     Heart sounds: Normal heart sounds, S1 normal and S2 normal. No murmur heard. Pulmonary:     Effort: Pulmonary effort is normal. No accessory muscle usage  or respiratory distress.     Breath sounds: Normal breath sounds. No stridor. No wheezing, rhonchi or rales.     Comments: Clear to auscultation bilaterally Musculoskeletal:     Comments: Strength 5/5 bilateral upper and lower  extremities  Neurological:     General: No focal deficit present.     Mental Status: He is alert and oriented to person, place, and time.     Cranial Nerves: Cranial nerves 2-12 are intact.     Motor: Motor function is intact.     Coordination: Coordination is intact.     Gait: Gait is intact.     Comments: No focal neurological defect on exam  Psychiatric:        Behavior: Behavior is cooperative.      UC Treatments / Results  Labs (all labs ordered are listed, but only abnormal results are displayed) Labs Reviewed  POCT FASTING CBG [facility] MANUAL ENTRY - Abnormal; Notable for the following components:      Result Value   POCT Glucose ([facility]) 238 (*)    All other components within normal limits    EKG   Radiology No results found.  Procedures Procedures (including critical care time)  Medications Ordered in UC Medications - No data to display  Initial Impression / Assessment and Plan / UC Course  I have reviewed the triage vital signs and the nursing notes.  Pertinent labs & imaging results that were available during my care of the patient were reviewed by me and considered in my medical decision making (see chart for details).     Patient is hypertensive and tachycardic but otherwise well-appearing and nontoxic.  He reports that his symptoms have resolved but he continues to have a significant headache.  Since he is not actively symptomatic and last known normal was 6.5 hours ago a code stroke was not initiated.  Random glucose was elevated at 238.  We discussed that given his new onset neurological symptoms, headache in the setting of hypertension he needs to go to the emergency room for further evaluation and management since we do not have  advanced imaging capabilities in urgent care.  Offered EMS transport but he declined this.  He will have his family member take him directly to [facility], ER.  He was stable at time of discharge.  Final Clinical Impressions(s) / UC Diagnoses   Final diagnoses:  Slurred speech  Nonintractable headache, unspecified chronicity pattern, unspecified headache type  Hypertensive emergency   Discharge Instructions   None    ED Prescriptions   None    PDMP not reviewed this encounter.   Sherrell Rocky POUR, PA-C 01/05/24 1829

## 2024-01-05 NOTE — ED Provider Notes (Incomplete)
 [place] EMERGENCY DEPARTMENT AT [facility] Provider Note   CSN: [id number] Arrival date & time: 01/05/24  8171  An emergency department physician performed an initial assessment on this suspected stroke patient at 72.  Patient presents with: Aphasia   Charles Burton is a 63 y.o. male with past medical history significant for HLD, HTN, anxiety, GERD, DM2, and nephrolithiasis who presents as a code stroke.  Patient had onset of severe headache and slurred speech at approximately noon earlier today while loading boxes at his house.  Patient's daughter at bedside witnessed the onset of the symptoms and states that his speech was significantly garbled/difficult to understand.  Symptoms lasted almost 1 hour, however patient went to sleep within this timeline and when he woke up from nap had notable improvement in dysarthria and had slightly improved headache.  Patient was seen at urgent care roughly 1.5 hours prior to ED arrival and was recommended to present to ED for further evaluation. Patient states that he often forgets to take his antihypertensives, BP elevated today to 190/100s in ED.   Prior to Admission medications   Medication Sig Start Date End Date Taking? Authorizing Provider  amphetamine-dextroamphetamine (ADDERALL) 20 MG tablet Take 20 mg by mouth daily.    [provider]  aspirin  81 MG tablet Take 81 mg by mouth daily.    [provider]  atorvastatin  (LIPITOR ) 80 MG tablet  05/18/12   [provider]  benzonatate  (TESSALON ) 100 MG capsule Take 1 capsule (100 mg total) by mouth every 8 (eight) hours. 10/08/16   Leann Satterfield, NP  buPROPion (WELLBUTRIN XL) 300 MG 24 hr tablet Take 300 mg by mouth daily.    [provider]  clonazePAM (KLONOPIN) 0.5 MG tablet Take 0.5 mg by mouth 3 (three) times daily as needed.    [provider]  glimepiride (AMARYL) 2 MG tablet Take 2 mg by mouth daily before breakfast.    [provider]  Lancets (ONETOUCH ULTRASOFT) lancets  04/04/12   [provider]  lisinopril (PRINIVIL,ZESTRIL) 20 MG tablet Take 20 mg by mouth daily.    [provider]  ONE TOUCH ULTRA TEST test strip  04/04/12   [provider]  oseltamivir  (TAMIFLU ) 75 MG capsule Take 1 capsule (75 mg total) by mouth every 12 (twelve) hours. 10/08/16   Leann Satterfield, NP  pantoprazole (PROTONIX) 40 MG tablet Take 40 mg by mouth daily.    [provider]  sertraline (ZOLOFT) 100 MG tablet Take 100 mg by mouth daily.    [provider]  tadalafil (CIALIS) 20 MG tablet Take 20 mg by mouth daily as needed.    [provider]  Testosterone (TESTIM TD) Place onto the skin.    [provider]  triazolam (HALCION) 0.25 MG tablet Take 0.25 mg by mouth at bedtime as needed.    [provider]    Allergies: Patient has no known allergies.     Updated Vital Signs BP (!) (P) 158/96 (BP Location: Right Arm)   Pulse 89   Temp 98.4 F (36.9 C) (Oral)   Resp 17   Ht 5' 10 (1.778 m)   Wt 86.2 kg   SpO2 99%   BMI 27.26 kg/m   Physical Exam Vitals reviewed.  Constitutional:      General: He is not in acute distress.    Appearance: He is not toxic-appearing or diaphoretic.  HENT:     Head: Normocephalic and atraumatic.  Nose: Nose normal. No rhinorrhea.     Mouth/Throat:     Mouth: Mucous membranes are moist.     Pharynx: Oropharynx is clear.  Eyes:     General: No visual field deficit or scleral icterus.    Extraocular Movements: Extraocular movements intact.     Pupils: Pupils are equal, round, and reactive to light.  Cardiovascular:     Rate and Rhythm: Normal rate and regular rhythm.     Pulses: Normal pulses.     Heart sounds: No murmur heard.    No gallop.  Pulmonary:     Effort: Pulmonary effort is normal. No respiratory distress.     Breath sounds: Normal breath sounds.  Abdominal:     General: Abdomen is flat.      Palpations: Abdomen is soft.     Tenderness: There is no abdominal tenderness. There is no guarding.  Musculoskeletal:        General: No deformity. Normal range of motion.     Cervical back: Normal range of motion and neck supple. No rigidity.     Right lower leg: No edema.     Left lower leg: No edema.  Skin:    General: Skin is warm and dry.     Capillary Refill: Capillary refill takes less than 2 seconds.     Findings: No lesion.  Neurological:     Mental Status: He is alert and oriented to person, place, and time.     GCS: GCS eye subscore is 4. GCS verbal subscore is 5. GCS motor subscore is 6.     Cranial Nerves: No dysarthria or facial asymmetry.     Sensory: Sensation is intact. No sensory deficit.     Motor: No weakness, tremor or pronator drift.     Coordination: Coordination is intact.     (all labs ordered are listed, but only abnormal results are displayed) Labs Reviewed  APTT - Abnormal; Notable for the following components:      Result Value   aPTT 42 (*)    All other components within normal limits  COMPREHENSIVE METABOLIC PANEL WITH GFR - Abnormal; Notable for the following components:   Glucose, Bld 227 (*)    All other components within normal limits  GLUCOSE, CAPILLARY - Abnormal; Notable for the following components:   Glucose-Capillary 163 (*)    All other components within normal limits  I-STAT CHEM 8, ED - Abnormal; Notable for the following components:   Glucose, Bld 224 (*)    Calcium , Ion 1.10 (*)    All other components within normal limits  CBG MONITORING, ED - Abnormal; Notable for the following components:   Glucose-Capillary 227 (*)    All other components within normal limits  PROTIME-INR  CBC  DIFFERENTIAL  ETHANOL  HIV ANTIBODY (ROUTINE TESTING W REFLEX)  BASIC METABOLIC PANEL WITH GFR  CBC  MAGNESIUM  PHOSPHORUS  HEMOGLOBIN A1C  LIPID PANEL    EKG: None  Radiology: DG CHEST PORT 1 VIEW Result Date: 01/05/2024 CLINICAL DATA:   Cough.  Aphasia. EXAM: PORTABLE CHEST 1 VIEW COMPARISON:  10/17/2017 FINDINGS: Shallow inspiration. Heart size and pulmonary vascularity are normal. Lungs are clear. No pleural effusion or pneumothorax. Mediastinal contours appear intact. Calcification of the aorta. Degenerative changes in the spine. IMPRESSION: No active disease. Electronically Signed   By: Elsie Gravely M.D.   On: 01/05/2024 22:07   CT ANGIO HEAD NECK W WO CM Result Date: 01/05/2024 CLINICAL DATA:  Initial evaluation for  acute stroke. EXAM: CT ANGIOGRAPHY HEAD AND NECK WITH AND WITHOUT CONTRAST TECHNIQUE: Multidetector CT imaging of the head and neck was performed using the standard protocol during bolus administration of intravenous contrast. Multiplanar CT image reconstructions and MIPs were obtained to evaluate the vascular anatomy. Carotid stenosis measurements (when applicable) are obtained utilizing NASCET criteria, using the distal internal carotid diameter as the denominator. RADIATION DOSE REDUCTION: This exam was performed according to the departmental dose-optimization program which includes automated exposure control, adjustment of the mA and/or kV according to patient size and/or use of iterative reconstruction technique. CONTRAST:  75mL OMNIPAQUE  IOHEXOL  350 MG/ML SOLN COMPARISON: Prior CT and MRI from earlier the same day. FINDINGS: CTA NECK FINDINGS Aortic arch: Visualized aortic arch within normal limits for caliber with standard branch pattern. Aortic atherosclerosis. No significant stenosis about the origin the great vessels. Right carotid system: Right common and internal carotid arteries are patent without dissection. Minimal atheromatous change about the right carotid bulb without stenosis. Left carotid system: Left common and internal carotid arteries are patent without dissection. Mild atheromatous change about the left carotid bulb without stenosis. Vertebral arteries: Left vertebral artery arises directly from the  aortic arch. Right vertebral artery dominant. Vertebral arteries are patent without stenosis or dissection. Skeleton: No discrete or worrisome osseous lesions. Other neck: No other acute finding. Upper chest: No other acute finding. Review of the MIP images confirms the above findings CTA HEAD FINDINGS Anterior circulation: Atheromatous change about the carotid siphons without hemodynamically significant stenosis. A1 segments patent bilaterally. Normal anterior communicating artery complex. Anterior cerebral arteries patent without significant stenosis. No M1 stenosis or occlusion. Distal MCA branches perfused and symmetric. Posterior circulation: Both V4 segments patent without significant stenosis. Both PICA patent. Basilar patent without stenosis. Superior cerebellar and posterior cerebral arteries patent bilaterally. Venous sinuses: Grossly patent allowing for timing the contrast bolus. Anatomic variants: As above.  No aneurysm. Review of the MIP images confirms the above findings IMPRESSION: 1. Negative CTA for large vessel occlusion or other emergent finding. 2. Mild atheromatous change about the carotid bifurcations and carotid siphons without hemodynamically significant stenosis. 3.  Aortic Atherosclerosis (ICD10-I70.0). Electronically Signed   By: Morene Hoard M.D.   On: 01/05/2024 21:42   MR BRAIN WO CONTRAST Result Date: 01/05/2024 CLINICAL DATA:  Initial evaluation for acute TIA. EXAM: MRI HEAD WITHOUT CONTRAST TECHNIQUE: Multiplanar, multiecho pulse sequences of the brain and surrounding structures were obtained without intravenous contrast. COMPARISON:  Prior CT from earlier the same day. FINDINGS: Brain: Cerebral volume within normal limits. Patchy T2/FLAIR hyperintensity involving the supratentorial cerebral white matter, consistent with chronic small vessel ischemic disease, moderate in nature. 7 mm focus of diffusion signal abnormality seen involving the subcortical posterior right  frontal lobe (series 5, image 87), consistent with a small acute ischemic infarct. No associated hemorrhage or mass effect. No other evidence for acute or subacute ischemia. No acute or chronic intracranial blood products. No mass lesion, midline shift or mass effect. No hydrocephalus or extra-axial fluid collection. Pituitary gland within normal limits. Vascular: Major intracranial vascular flow voids are maintained. Skull and upper cervical spine: Craniocervical junction within normal limits. Bone marrow signal intensity normal. No scalp soft tissue abnormality. Sinuses/Orbits: Globes orbital soft tissues within normal limits. Mild-to-moderate mucosal thickening about the ethmoidal air cells and right maxillary sinus. 12 mm T2 hyperintense lesion within the right nasal cavity, nonspecific, but could potentially reflect a polyp (series 10, image 4). No mastoid effusion. Other: None. IMPRESSION: 1. 7 mm  acute ischemic nonhemorrhagic subcortical posterior right frontal lobe infarct. 2. Underlying moderate chronic microvascular ischemic disease. 3. 12 mm T2 hyperintense lesion within the right nasal cavity, nonspecific, but could potentially reflect a polyp. Correlation with physical exam suggested. Electronically Signed   By: Morene Hoard M.D.   On: 01/05/2024 21:18   CT HEAD CODE STROKE WO CONTRAST Result Date: 01/05/2024 CLINICAL DATA:  Code stroke.  Neuro deficit, concern for stroke. EXAM: CT HEAD WITHOUT CONTRAST TECHNIQUE: Contiguous axial images were obtained from the base of the skull through the vertex without intravenous contrast. RADIATION DOSE REDUCTION: This exam was performed according to the departmental dose-optimization program which includes automated exposure control, adjustment of the mA and/or kV according to patient size and/or use of iterative reconstruction technique. COMPARISON:  None Available. FINDINGS: Brain: No acute intracranial hemorrhage. No CT evidence of acute infarct.  Nonspecific hypoattenuation in the periventricular and subcortical white matter favored to reflect chronic microvascular ischemic changes. No edema, mass effect, or midline shift. The basilar cisterns are patent. Ventricles: The ventricles are normal. Vascular: Atherosclerotic calcifications of the carotid siphons. No hyperdense vessel. Skull: No acute or aggressive finding. Orbits: Orbits are symmetric. Sinuses: Mucosal thickening in the right maxillary sinus. Additional mild mucosal thickening in the ethmoid sinuses, right greater than left. Other: Mastoid air cells are clear. ASPECTS [facility] Stroke Program Early CT Score) - Ganglionic level infarction (caudate, lentiform nuclei, internal capsule, insula, M1-M3 cortex): 7 - Supraganglionic infarction (M4-M6 cortex): 3 Total score (0-10 with 10 being normal): 10 IMPRESSION: 1. No CT evidence of acute intracranial abnormality. 2. Chronic microvascular ischemic changes. 3. ASPECTS is 10 These results were communicated to Dr. Vanessa at 7:05 pm on 01/05/2024 by text page via the [facility] messaging system. Electronically Signed   By: Donnice Mania M.D.   On: 01/05/2024 19:06     Medications Ordered in the ED  enoxaparin  (LOVENOX ) injection 40 mg (40 mg Subcutaneous Given 01/05/24 2044)  sodium chloride  flush (NS) 0.9 % injection 3 mL (3 mLs Intravenous Given 01/05/24 2329)  acetaminophen  (TYLENOL ) tablet 1,000 mg (has no administration in time range)  albuterol  (PROVENTIL ) (2.5 MG/3ML) 0.083% nebulizer solution 2.5 mg (has no administration in time range)  melatonin tablet 6 mg (has no administration in time range)  ondansetron  (ZOFRAN ) injection 4 mg (has no administration in time range)  polyethylene glycol (MIRALAX  / GLYCOLAX ) packet 17 g (has no administration in time range)  aspirin  tablet 325 mg (325 mg Oral Given 01/05/24 2044)    Followed by  aspirin  EC tablet 81 mg (has no administration in time range)  clopidogrel  (PLAVIX ) tablet 300 mg (300 mg Oral  Given 01/05/24 2044)    Followed by  clopidogrel  (PLAVIX ) tablet 75 mg (has no administration in time range)  insulin  aspart (novoLOG ) injection 0-6 Units (has no administration in time range)  insulin  aspart (novoLOG ) injection 0-5 Units ( Subcutaneous Not Given 01/05/24 2353)  atorvastatin  (LIPITOR ) tablet 80 mg (80 mg Oral Given 01/05/24 2328)  sodium chloride  flush (NS) 0.9 % injection 3 mL (3 mLs Intravenous Given 01/05/24 1900)  sodium chloride  0.9 % bolus 1,000 mL (1,000 mLs Intravenous New Bag/Given 01/05/24 2058)  iohexol  (OMNIPAQUE ) 350 MG/ML injection 75 mL (75 mLs Intravenous Contrast Given 01/05/24 2127)     Medical Decision Making Patient with the above PMHx who presents following episode of stroke-like symptoms with onset ~12pm earlier today, including garbled/nonsensical speech and facial weakness reported by family. Patient's acute neuro deficits resolved following a nap  and by time of ED arrival. He did have a severe headache at time of initial symptom onset, which is improved slightly but still present upon ED arrival. Patient has h/o HTN but is not consistently compliant with home antihypertensives. BP today is elevated to 190s/100s upon arrival.  Differential diagnoses include: acute stroke, TIA, hypertensive encephalopathy, PRES (though less likely given symptomology and return to baseline).  Code stroke initiated shortly after patient presented to ED and neurology provider evaluated patient. CT head wo showed no acute intracranial abnormality as below. Lab workup overall unremarkable. Neurology team recommended patient be admitted for further TIA workup, including MRI brain. Patient was admitted to hospitalist service in stable condition.   Amount and/or Complexity of Data Reviewed Labs: ordered.    Details: CBC and CMP unremarkable, POC glucose 227 Radiology: ordered.    Details: CT head with no acute intracranial abnormality, ASPECTS score 10 CXR unremarkable  Risk Decision  regarding hospitalization.     Final diagnoses:  Stroke-like episode  Slurred speech  Primary hypertension       Raoul Rake, MD 01/06/24 9764    Franklyn Sid SAILOR, MD 01/07/24 [phone]

## 2024-01-06 ENCOUNTER — Observation Stay (HOSPITAL_COMMUNITY)

## 2024-01-06 DIAGNOSIS — I6389 Other cerebral infarction: Secondary | ICD-10-CM

## 2024-01-06 DIAGNOSIS — I6381 Other cerebral infarction due to occlusion or stenosis of small artery: Secondary | ICD-10-CM | POA: Diagnosis present

## 2024-01-06 DIAGNOSIS — E1165 Type 2 diabetes mellitus with hyperglycemia: Secondary | ICD-10-CM

## 2024-01-06 DIAGNOSIS — K219 Gastro-esophageal reflux disease without esophagitis: Secondary | ICD-10-CM | POA: Diagnosis present

## 2024-01-06 DIAGNOSIS — G4733 Obstructive sleep apnea (adult) (pediatric): Secondary | ICD-10-CM | POA: Diagnosis present

## 2024-01-06 DIAGNOSIS — R29810 Facial weakness: Secondary | ICD-10-CM | POA: Diagnosis present

## 2024-01-06 DIAGNOSIS — F17290 Nicotine dependence, other tobacco product, uncomplicated: Secondary | ICD-10-CM | POA: Diagnosis present

## 2024-01-06 DIAGNOSIS — E1140 Type 2 diabetes mellitus with diabetic neuropathy, unspecified: Secondary | ICD-10-CM | POA: Diagnosis present

## 2024-01-06 DIAGNOSIS — R4702 Dysphasia: Secondary | ICD-10-CM | POA: Diagnosis present

## 2024-01-06 DIAGNOSIS — R29702 NIHSS score 2: Secondary | ICD-10-CM | POA: Diagnosis present

## 2024-01-06 DIAGNOSIS — R471 Dysarthria and anarthria: Secondary | ICD-10-CM | POA: Diagnosis present

## 2024-01-06 DIAGNOSIS — E1151 Type 2 diabetes mellitus with diabetic peripheral angiopathy without gangrene: Secondary | ICD-10-CM

## 2024-01-06 DIAGNOSIS — I639 Cerebral infarction, unspecified: Secondary | ICD-10-CM

## 2024-01-06 DIAGNOSIS — E785 Hyperlipidemia, unspecified: Secondary | ICD-10-CM | POA: Diagnosis present

## 2024-01-06 DIAGNOSIS — R4701 Aphasia: Secondary | ICD-10-CM | POA: Diagnosis present

## 2024-01-06 DIAGNOSIS — R2990 Unspecified symptoms and signs involving the nervous system: Secondary | ICD-10-CM | POA: Diagnosis not present

## 2024-01-06 DIAGNOSIS — Z7902 Long term (current) use of antithrombotics/antiplatelets: Secondary | ICD-10-CM | POA: Diagnosis not present

## 2024-01-06 DIAGNOSIS — E890 Postprocedural hypothyroidism: Secondary | ICD-10-CM | POA: Diagnosis present

## 2024-01-06 DIAGNOSIS — Z7982 Long term (current) use of aspirin: Secondary | ICD-10-CM | POA: Diagnosis not present

## 2024-01-06 DIAGNOSIS — I10 Essential (primary) hypertension: Secondary | ICD-10-CM | POA: Diagnosis present

## 2024-01-06 DIAGNOSIS — I6350 Cerebral infarction due to unspecified occlusion or stenosis of unspecified cerebral artery: Secondary | ICD-10-CM | POA: Diagnosis not present

## 2024-01-06 DIAGNOSIS — Z794 Long term (current) use of insulin: Secondary | ICD-10-CM | POA: Diagnosis not present

## 2024-01-06 DIAGNOSIS — I6340 Cerebral infarction due to embolism of unspecified cerebral artery: Secondary | ICD-10-CM | POA: Diagnosis not present

## 2024-01-06 DIAGNOSIS — Z79899 Other long term (current) drug therapy: Secondary | ICD-10-CM | POA: Diagnosis not present

## 2024-01-06 DIAGNOSIS — Z7984 Long term (current) use of oral hypoglycemic drugs: Secondary | ICD-10-CM | POA: Diagnosis not present

## 2024-01-06 LAB — CBC
HCT: 42.7 % (ref 39.0–52.0)
Hemoglobin: 14.4 g/dL (ref 13.0–17.0)
MCH: 27.9 pg (ref 26.0–34.0)
MCHC: 33.7 g/dL (ref 30.0–36.0)
MCV: 82.8 fL (ref 80.0–100.0)
Platelets: 203 K/uL (ref 150–400)
RBC: 5.16 MIL/uL (ref 4.22–5.81)
RDW: 13.4 % (ref 11.5–15.5)
WBC: 5.9 K/uL (ref 4.0–10.5)
nRBC: 0 % (ref 0.0–0.2)

## 2024-01-06 LAB — LIPID PANEL
Cholesterol: 184 mg/dL (ref 0–200)
HDL: 40 mg/dL — ABNORMAL LOW (ref >40)
LDL Cholesterol: 117 mg/dL — ABNORMAL HIGH (ref 0–99)
Total CHOL/HDL Ratio: 4.6 ratio
Triglycerides: 135 mg/dL (ref <150)
VLDL: 27 mg/dL (ref 0–40)

## 2024-01-06 LAB — HIV ANTIBODY (ROUTINE TESTING W REFLEX): HIV Screen 4th Generation wRfx: NONREACTIVE

## 2024-01-06 LAB — RAPID URINE DRUG SCREEN, HOSP PERFORMED
Amphetamines: POSITIVE — AB
Barbiturates: NOT DETECTED
Benzodiazepines: NOT DETECTED
Cocaine: NOT DETECTED
Opiates: NOT DETECTED
Tetrahydrocannabinol: NOT DETECTED

## 2024-01-06 LAB — GLUCOSE, CAPILLARY
Glucose-Capillary: 164 mg/dL — ABNORMAL HIGH (ref 70–99)
Glucose-Capillary: 196 mg/dL — ABNORMAL HIGH (ref 70–99)
Glucose-Capillary: 157 mg/dL — ABNORMAL HIGH (ref 70–99)
Glucose-Capillary: 190 mg/dL — ABNORMAL HIGH (ref 70–99)

## 2024-01-06 LAB — HEMOGLOBIN A1C
Hgb A1c MFr Bld: 12.1 % — ABNORMAL HIGH (ref 4.8–5.6)
Mean Plasma Glucose: 300.57 mg/dL

## 2024-01-06 LAB — ECHOCARDIOGRAM COMPLETE
Area-P 1/2: 2.42 cm2
Height: 70 in
S' Lateral: 2.4 cm
Weight: 3040 [oz_av]

## 2024-01-06 LAB — PHOSPHORUS: Phosphorus: 2.6 mg/dL (ref 2.5–4.6)

## 2024-01-06 LAB — BASIC METABOLIC PANEL WITH GFR
Anion gap: 7 (ref 5–15)
BUN: 13 mg/dL (ref 8–23)
CO2: 24 mmol/L (ref 22–32)
Calcium: 8.8 mg/dL — ABNORMAL LOW (ref 8.9–10.3)
Chloride: 107 mmol/L (ref 98–111)
Creatinine, Ser: 0.9 mg/dL (ref 0.61–1.24)
GFR, Estimated: >60 mL/min (ref >60)
Glucose, Bld: 188 mg/dL — ABNORMAL HIGH (ref 70–99)
Potassium: 3.5 mmol/L (ref 3.5–5.1)
Sodium: 138 mmol/L (ref 135–145)

## 2024-01-06 LAB — MAGNESIUM: Magnesium: 1.8 mg/dL (ref 1.7–2.4)

## 2024-01-06 MED ORDER — INSULIN GLARGINE-YFGN 100 UNIT/ML ~~LOC~~ SOLN
7.0000 [IU] | SUBCUTANEOUS | Status: DC
  Administered 2024-01-06 – 2024-01-08 (×3): 7 [IU] via SUBCUTANEOUS
  Filled 2024-01-06 (×3): qty 0.07

## 2024-01-06 MED ORDER — STROKE: EARLY STAGES OF RECOVERY BOOK: Freq: Once | Status: AC

## 2024-01-06 NOTE — Progress Notes (Signed)
 PROGRESS NOTE  KENTAVIUS DETTORE FMW:[id number] DOB: 02/20/1961 DOA: 01/05/2024 PCP: System, Provider Not In   LOS: 0 days   Brief Narrative / Interim history: 63 year old male with DM2, HTN, HLD, OSA who comes into the hospital with strokelike symptoms, dysarthria, expressive aphasia and transient numbness in the right hand.  Neurology was consulted and he was admitted to the hospital  Subjective / 24h Interval events: Doing well today, feeling almost back to baseline.  Assesement and Plan: Principal problem Acute CVA -patient presented to the hospital with concern for stroke.  Neurology consulted and followed patient while hospitalized.  An MRI of the brain showed 7 mm acute ischemic nonhemorrhagic subcortical posterior right frontal lobe infarct, but also underlying moderate chronic microvascular ischemic disease.  CT angiogram was negative for large vessel occlusion but it did show atherosclerosis.  Lipid panel showed an LDL of 117.  A1c was elevated 12.1.  A 2D echocardiogram showed is pending.  Discussed with Dr. Jerri, patient still with symptoms today with mild expressive and receptive aphasia, right facial droop, which do not fit well with MRI findings.  Neurology wants to repeat a limited MRI later tonight/tomorrow morning, lower extremity Dopplers and recommending a 30-day monitoring  Active problems Diabetes mellitus, poorly controlled, with hyperglycemia -his A1c was found to be elevated at 12.1.  At home he is on glimepiride metformin and Ozempic, however he admits that he is not taking the medications as prescribed and is liberal with his diet.  Long discussion with the patient at bedside, given A1c above 10 I do recommend insulin , he is agreeable and will start once daily long-acting.  Recommend outpatient follow-up  Hyperlipidemia-continue statin  Essential hypertension -allow permissive hypertension now  OSA-does not use CPAP   Scheduled Meds:  aspirin  EC  81 mg Oral Daily    atorvastatin   80 mg Oral QHS   clopidogrel   75 mg Oral Daily   enoxaparin  (LOVENOX ) injection  40 mg Subcutaneous Q24H   insulin  aspart  0-5 Units Subcutaneous QHS   insulin  aspart  0-6 Units Subcutaneous TID WC   insulin  glargine-yfgn  7 Units Subcutaneous Q24H   sodium chloride  flush  3 mL Intravenous Q12H   Continuous Infusions: PRN Meds:.acetaminophen , albuterol , melatonin, ondansetron  (ZOFRAN ) IV, polyethylene glycol  Current Outpatient Medications  Medication Instructions   amphetamine-dextroamphetamine (ADDERALL) 20 MG tablet 20 mg, Daily   aspirin  81 mg, Daily   atorvastatin  (LIPITOR ) 80 MG tablet No dose, route, or frequency recorded.   benzonatate  (TESSALON ) 100 mg, Oral, Every 8 hours   buPROPion (WELLBUTRIN XL) 300 mg, Daily   clonazePAM (KLONOPIN) 0.5 mg, 3 times daily PRN   glimepiride (AMARYL) 2 mg, Daily before breakfast   Lancets (ONETOUCH ULTRASOFT) lancets No dose, route, or frequency recorded.   lisinopril (ZESTRIL) 20 mg, Daily   ONE TOUCH ULTRA TEST test strip No dose, route, or frequency recorded.   oseltamivir  (TAMIFLU ) 75 mg, Oral, Every 12 hours   pantoprazole (PROTONIX) 40 mg, Daily   sertraline (ZOLOFT) 100 mg, Daily   tadalafil (CIALIS) 20 mg, Daily PRN   Testosterone (TESTIM TD) Place onto the skin.   triazolam (HALCION) 0.25 mg, At bedtime PRN    Diet Orders (From admission, onward)     Start     Ordered   01/05/24 2011  Diet Heart Room service appropriate? Yes; Fluid consistency: Thin  Diet effective now       Comments: Ok for diet if passes swallow screen  Question Answer Comment  Room service appropriate? Yes   Fluid consistency: Thin      01/05/24 2011            DVT prophylaxis: enoxaparin  (LOVENOX ) injection 40 mg Start: 01/05/24 2100   Lab Results  Component Value Date   PLT 203 01/06/2024      Code Status: Full Code  Family Communication: No family at bedside  Status is: Observation The patient will require care  spanning > 2 midnights and should be moved to inpatient because: Ongoing symptoms and workup for his stroke   Level of care: Telemetry Medical  Consultants:  Neurology  Objective: Vitals:   01/05/24 2231 01/05/24 2331 01/06/24 0409 01/06/24 0755  BP: (!) 158/92 (!) 158/96 (!) 143/95 (!) 157/91  Pulse: 97 89  81  Resp: 18 17 18 17   Temp: 98.5 F (36.9 C) 98.4 F (36.9 C) 98 F (36.7 C) 97.8 F (36.6 C)  TempSrc: Oral Oral Oral Oral  SpO2: 100% 99% 100% 99%  Weight:      Height:        Intake/Output Summary (Last 24 hours) at 01/06/2024 1048 Last data filed at 01/06/2024 0958 Gross per 24 hour  Intake 243 ml  Output --  Net 243 ml   Wt Readings from Last 3 Encounters:  01/05/24 86.2 kg  06/05/12 88 kg    Examination:  Constitutional: NAD Eyes: no scleral icterus ENMT: Mucous membranes are moist.  Neck: normal, supple Respiratory: clear to auscultation bilaterally, no wheezing, no crackles.  Cardiovascular: Regular rate and rhythm, no murmurs / rubs / gallops. No LE edema.  Abdomen: non distended, no tenderness. Bowel sounds positive.  Musculoskeletal: no clubbing / cyanosis.   Data Reviewed: I have independently reviewed following labs and imaging studies   CBC Recent Labs  Lab 01/05/24 1846 01/05/24 1850 01/06/24 0513  WBC 8.0  --  5.9  HGB 15.6 16.3 14.4  HCT 46.2 48.0 42.7  PLT 221  --  203  MCV 85.1  --  82.8  MCH 28.7  --  27.9  MCHC 33.8  --  33.7  RDW 13.4  --  13.4  LYMPHSABS 1.5  --   --   MONOABS 0.7  --   --   EOSABS 0.2  --   --   BASOSABS 0.1  --   --     Recent Labs  Lab 01/05/24 1846 01/05/24 1850 01/06/24 0513 01/06/24 0514  NA 139 138 138  --   K 3.8 3.8 3.5  --   CL 103 102 107  --   CO2 24  --  24  --   GLUCOSE 227* 224* 188*  --   BUN 13 16 13   --   CREATININE 0.97 0.90 0.90  --   CALCIUM  9.1  --  8.8*  --   AST 20  --   --   --   ALT 18  --   --   --   ALKPHOS 64  --   --   --   BILITOT 0.8  --   --   --   ALBUMIN  3.6  --   --   --   MG  --   --  1.8  --   INR 1.0  --   --   --   HGBA1C  --   --   --  12.1*    ------------------------------------------------------------------------------------------------------------------ Recent Labs    01/06/24 0514  CHOL 184  HDL 40*  LDLCALC 117*  TRIG 135  CHOLHDL 4.6    Lab Results  Component Value Date   HGBA1C 12.1 (H) 01/06/2024   ------------------------------------------------------------------------------------------------------------------ No results for input(s): TSH, T4TOTAL, T3FREE, THYROIDAB in the last 72 hours.  Invalid input(s): FREET3  Cardiac Enzymes No results for input(s): CKMB, TROPONINI, MYOGLOBIN in the last 168 hours.  Invalid input(s): CK ------------------------------------------------------------------------------------------------------------------ No results found for: BNP  CBG: Recent Labs  Lab 01/05/24 1849 01/05/24 2343 01/06/24 0606  GLUCAP 227* 163* 164*    No results found for this or any previous visit (from the past 240 hours).   Radiology Studies: DG CHEST PORT 1 VIEW Result Date: 01/05/2024 CLINICAL DATA:  Cough.  Aphasia. EXAM: PORTABLE CHEST 1 VIEW COMPARISON:  10/17/2017 FINDINGS: Shallow inspiration. Heart size and pulmonary vascularity are normal. Lungs are clear. No pleural effusion or pneumothorax. Mediastinal contours appear intact. Calcification of the aorta. Degenerative changes in the spine. IMPRESSION: No active disease. Electronically Signed   By: Elsie Gravely M.D.   On: 01/05/2024 22:07   CT ANGIO HEAD NECK W WO CM Result Date: 01/05/2024 CLINICAL DATA:  Initial evaluation for acute stroke. EXAM: CT ANGIOGRAPHY HEAD AND NECK WITH AND WITHOUT CONTRAST TECHNIQUE: Multidetector CT imaging of the head and neck was performed using the standard protocol during bolus administration of intravenous contrast. Multiplanar CT image reconstructions and MIPs were obtained to  evaluate the vascular anatomy. Carotid stenosis measurements (when applicable) are obtained utilizing NASCET criteria, using the distal internal carotid diameter as the denominator. RADIATION DOSE REDUCTION: This exam was performed according to the departmental dose-optimization program which includes automated exposure control, adjustment of the mA and/or kV according to patient size and/or use of iterative reconstruction technique. CONTRAST:  75mL OMNIPAQUE  IOHEXOL  350 MG/ML SOLN COMPARISON: Prior CT and MRI from earlier the same day. FINDINGS: CTA NECK FINDINGS Aortic arch: Visualized aortic arch within normal limits for caliber with standard branch pattern. Aortic atherosclerosis. No significant stenosis about the origin the great vessels. Right carotid system: Right common and internal carotid arteries are patent without dissection. Minimal atheromatous change about the right carotid bulb without stenosis. Left carotid system: Left common and internal carotid arteries are patent without dissection. Mild atheromatous change about the left carotid bulb without stenosis. Vertebral arteries: Left vertebral artery arises directly from the aortic arch. Right vertebral artery dominant. Vertebral arteries are patent without stenosis or dissection. Skeleton: No discrete or worrisome osseous lesions. Other neck: No other acute finding. Upper chest: No other acute finding. Review of the MIP images confirms the above findings CTA HEAD FINDINGS Anterior circulation: Atheromatous change about the carotid siphons without hemodynamically significant stenosis. A1 segments patent bilaterally. Normal anterior communicating artery complex. Anterior cerebral arteries patent without significant stenosis. No M1 stenosis or occlusion. Distal MCA branches perfused and symmetric. Posterior circulation: Both V4 segments patent without significant stenosis. Both PICA patent. Basilar patent without stenosis. Superior cerebellar and  posterior cerebral arteries patent bilaterally. Venous sinuses: Grossly patent allowing for timing the contrast bolus. Anatomic variants: As above.  No aneurysm. Review of the MIP images confirms the above findings IMPRESSION: 1. Negative CTA for large vessel occlusion or other emergent finding. 2. Mild atheromatous change about the carotid bifurcations and carotid siphons without hemodynamically significant stenosis. 3.  Aortic Atherosclerosis (ICD10-I70.0). Electronically Signed   By: Morene Hoard M.D.   On: 01/05/2024 21:42   MR BRAIN WO CONTRAST Result Date: 01/05/2024 CLINICAL DATA:  Initial evaluation for acute TIA. EXAM: MRI HEAD WITHOUT CONTRAST  TECHNIQUE: Multiplanar, multiecho pulse sequences of the brain and surrounding structures were obtained without intravenous contrast. COMPARISON:  Prior CT from earlier the same day. FINDINGS: Brain: Cerebral volume within normal limits. Patchy T2/FLAIR hyperintensity involving the supratentorial cerebral white matter, consistent with chronic small vessel ischemic disease, moderate in nature. 7 mm focus of diffusion signal abnormality seen involving the subcortical posterior right frontal lobe (series 5, image 87), consistent with a small acute ischemic infarct. No associated hemorrhage or mass effect. No other evidence for acute or subacute ischemia. No acute or chronic intracranial blood products. No mass lesion, midline shift or mass effect. No hydrocephalus or extra-axial fluid collection. Pituitary gland within normal limits. Vascular: Major intracranial vascular flow voids are maintained. Skull and upper cervical spine: Craniocervical junction within normal limits. Bone marrow signal intensity normal. No scalp soft tissue abnormality. Sinuses/Orbits: Globes orbital soft tissues within normal limits. Mild-to-moderate mucosal thickening about the ethmoidal air cells and right maxillary sinus. 12 mm T2 hyperintense lesion within the right nasal cavity,  nonspecific, but could potentially reflect a polyp (series 10, image 4). No mastoid effusion. Other: None. IMPRESSION: 1. 7 mm acute ischemic nonhemorrhagic subcortical posterior right frontal lobe infarct. 2. Underlying moderate chronic microvascular ischemic disease. 3. 12 mm T2 hyperintense lesion within the right nasal cavity, nonspecific, but could potentially reflect a polyp. Correlation with physical exam suggested. Electronically Signed   By: Morene Hoard M.D.   On: 01/05/2024 21:18   CT HEAD CODE STROKE WO CONTRAST Result Date: 01/05/2024 CLINICAL DATA:  Code stroke.  Neuro deficit, concern for stroke. EXAM: CT HEAD WITHOUT CONTRAST TECHNIQUE: Contiguous axial images were obtained from the base of the skull through the vertex without intravenous contrast. RADIATION DOSE REDUCTION: This exam was performed according to the departmental dose-optimization program which includes automated exposure control, adjustment of the mA and/or kV according to patient size and/or use of iterative reconstruction technique. COMPARISON:  None Available. FINDINGS: Brain: No acute intracranial hemorrhage. No CT evidence of acute infarct. Nonspecific hypoattenuation in the periventricular and subcortical white matter favored to reflect chronic microvascular ischemic changes. No edema, mass effect, or midline shift. The basilar cisterns are patent. Ventricles: The ventricles are normal. Vascular: Atherosclerotic calcifications of the carotid siphons. No hyperdense vessel. Skull: No acute or aggressive finding. Orbits: Orbits are symmetric. Sinuses: Mucosal thickening in the right maxillary sinus. Additional mild mucosal thickening in the ethmoid sinuses, right greater than left. Other: Mastoid air cells are clear. ASPECTS [facility] Stroke Program Early CT Score) - Ganglionic level infarction (caudate, lentiform nuclei, internal capsule, insula, M1-M3 cortex): 7 - Supraganglionic infarction (M4-M6 cortex): 3 Total score  (0-10 with 10 being normal): 10 IMPRESSION: 1. No CT evidence of acute intracranial abnormality. 2. Chronic microvascular ischemic changes. 3. ASPECTS is 10 These results were communicated to Dr. Vanessa at 7:05 pm on 01/05/2024 by text page via the [facility] messaging system. Electronically Signed   By: Donnice Mania M.D.   On: 01/05/2024 19:06     Nilda Fendt, MD, PhD [facility]  Between 7 am - 7 pm I am available, please contact me via Amion (for emergencies) or Securechat (non urgent messages)  Between 7 pm - 7 am I am not available, please contact night coverage MD/APP via Amion

## 2024-01-06 NOTE — Progress Notes (Signed)
 STROKE TEAM PROGRESS NOTE   SUBJECTIVE (INTERVAL HISTORY) His son and daughter are at the bedside.  Overall his condition is gradually improving. He still has mild expressive aphasia with paraphasic errors. He is right handed, with slight right facial droop. Concerning for left hemisphere infarct, but MRI showed right SO small infarct, will need to repeat limited MRI.    OBJECTIVE Temp:  [97.5 F (36.4 C)-99.1 F (37.3 C)] 99.1 F (37.3 C) (07/05 1931) Pulse Rate:  [81-91] 91 (07/05 1931) Cardiac Rhythm: Normal sinus rhythm;Heart block (07/05 1900) Resp:  [17-19] 19 (07/05 1931) BP: (143-171)/(91-100) 152/99 (07/05 1931) SpO2:  [97 %-100 %] 97 % (07/05 1931)  Recent Labs  Lab 01/05/24 2343 01/06/24 0606 01/06/24 1257 01/06/24 1632 01/06/24 2156  GLUCAP 163* 164* 196* 157* 190*   Recent Labs  Lab 01/05/24 1846 01/05/24 1850 01/06/24 0513  NA 139 138 138  K 3.8 3.8 3.5  CL 103 102 107  CO2 24  --  24  GLUCOSE 227* 224* 188*  BUN 13 16 13   CREATININE 0.97 0.90 0.90  CALCIUM  9.1  --  8.8*  MG  --   --  1.8  PHOS  --   --  2.6   Recent Labs  Lab 01/05/24 1846  AST 20  ALT 18  ALKPHOS 64  BILITOT 0.8  PROT 7.0  ALBUMIN 3.6   Recent Labs  Lab 01/05/24 1846 01/05/24 1850 01/06/24 0513  WBC 8.0  --  5.9  NEUTROABS 5.4  --   --   HGB 15.6 16.3 14.4  HCT 46.2 48.0 42.7  MCV 85.1  --  82.8  PLT 221  --  203   No results for input(s): CKTOTAL, CKMB, CKMBINDEX, TROPONINI in the last 168 hours. Recent Labs    01/05/24 1846  LABPROT 13.6  INR 1.0   No results for input(s): COLORURINE, LABSPEC, PHURINE, GLUCOSEU, HGBUR, BILIRUBINUR, KETONESUR, PROTEINUR, UROBILINOGEN, NITRITE, LEUKOCYTESUR in the last 72 hours.  Invalid input(s): APPERANCEUR     Component Value Date/Time   CHOL 184 01/06/2024 0514   TRIG 135 01/06/2024 0514   HDL 40 (L) 01/06/2024 0514   CHOLHDL 4.6 01/06/2024 0514   VLDL 27 01/06/2024 0514   LDLCALC 117  (H) 01/06/2024 0514   Lab Results  Component Value Date   HGBA1C 12.1 (H) 01/06/2024      Component Value Date/Time   LABOPIA NONE DETECTED 01/05/2024 0819   COCAINSCRNUR NONE DETECTED 01/05/2024 0819   LABBENZ NONE DETECTED 01/05/2024 0819   AMPHETMU POSITIVE (A) 01/05/2024 0819   THCU NONE DETECTED 01/05/2024 0819   LABBARB NONE DETECTED 01/05/2024 0819    Recent Labs  Lab 01/05/24 1846  ETH <15    I have personally reviewed the radiological images below and agree with the radiology interpretations.  MR BRAIN WO CONTRAST Result Date: 01/06/2024 CLINICAL DATA:  Follow-up examination for stroke. EXAM: MRI HEAD WITHOUT CONTRAST TECHNIQUE: Multiplanar, multiecho pulse sequences of the brain and surrounding structures were obtained without intravenous contrast. COMPARISON:  Prior studies from 01/05/2024 FINDINGS: Brain: Limited study with diffusion-weighted imaging only was performed. Previously identified 7 mm acute ischemic nonhemorrhagic infarct involving the subcortical posterior right frontal lobe again seen, stable. There is a new punctate acute ischemic infarct involving the contralateral left frontal cortex (series 2, image 38). No other evidence for new or interval infarction elsewhere. No other new abnormality seen on this limited exam. Vascular: Not well assessed on this limited exam. Skull and upper cervical spine:  Not well assessed on this limited exam. Sinuses/Orbits: Not well assessed on this limited exam. Other: None. IMPRESSION: 1. New punctate acute ischemic nonhemorrhagic infarct involving the left frontal cortex. 2. Stable 7 mm acute ischemic nonhemorrhagic subcortical posterior right frontal lobe infarct. Electronically Signed   By: Morene Hoard M.D.   On: 01/06/2024 17:45   ECHOCARDIOGRAM COMPLETE Result Date: 01/06/2024    ECHOCARDIOGRAM REPORT   Patient Name:   WRIGHT GRAVELY Date of Exam: 01/06/2024 Medical Rec #:  [id number]      Height:       70.0 in Accession #:     [id number]     Weight:       190.0 lb Date of Birth:  [birth year]      BSA:          2.042 m Patient Age:    63 years       BP:           143/95 mmHg Patient Gender: M              HR:           80 bpm. Exam Location:  Inpatient Procedure: 2D Echo, Color Doppler, Cardiac Doppler and Saline Contrast Bubble            Study (Both Spectral and Color Flow Doppler were utilized during            procedure). Indications:    TIA  History:        Patient has no prior history of Echocardiogram examinations.                 Risk Factors:Hypertension, Diabetes, Dyslipidemia and Sleep                 Apnea.  Sonographer:    Damien Senior RDCS Referring Phys: [id number] DORN DAWSON IMPRESSIONS  1. Left ventricular ejection fraction, by estimation, is 60 to 65%. The left ventricle has normal function. The left ventricle has no regional wall motion abnormalities. There is mild concentric left ventricular hypertrophy. Left ventricular diastolic parameters are consistent with Grade I diastolic dysfunction (impaired relaxation).  2. Right ventricular systolic function is normal. The right ventricular size is normal. Tricuspid regurgitation signal is inadequate for assessing PA pressure.  3. Agitated saline contrast bubble study was negative, with no evidence of any interatrial shunt.  4. The mitral valve is grossly normal. Trivial mitral valve regurgitation.  5. The aortic valve is tricuspid. There is mild calcification of the aortic valve. Aortic valve regurgitation is not visualized. Aortic valve sclerosis is present, with no evidence of aortic valve stenosis.  6. The inferior vena cava is normal in size with greater than 50% respiratory variability, suggesting right atrial pressure of 3 mmHg. Comparison(s): No prior Echocardiogram. FINDINGS  Left Ventricle: Left ventricular ejection fraction, by estimation, is 60 to 65%. The left ventricle has normal function. The left ventricle has no regional wall motion abnormalities. The  left ventricular internal cavity size was normal in size. There is  mild concentric left ventricular hypertrophy. Left ventricular diastolic parameters are consistent with Grade I diastolic dysfunction (impaired relaxation). Right Ventricle: The right ventricular size is normal. No increase in right ventricular wall thickness. Right ventricular systolic function is normal. Tricuspid regurgitation signal is inadequate for assessing PA pressure. Left Atrium: Left atrial size was normal in size. Right Atrium: Right atrial size was normal in size. Pericardium: There is no evidence of pericardial effusion. Mitral Valve: The mitral  valve is grossly normal. Trivial mitral valve regurgitation. Tricuspid Valve: The tricuspid valve is grossly normal. Tricuspid valve regurgitation is trivial. Aortic Valve: The aortic valve is tricuspid. There is mild calcification of the aortic valve. There is mild aortic valve annular calcification. Aortic valve regurgitation is not visualized. Aortic valve sclerosis is present, with no evidence of aortic valve stenosis. Pulmonic Valve: The pulmonic valve was grossly normal. Pulmonic valve regurgitation is trivial. Aorta: The aortic root and ascending aorta are structurally normal, with no evidence of dilitation. Venous: The inferior vena cava is normal in size with greater than 50% respiratory variability, suggesting right atrial pressure of 3 mmHg. IAS/Shunts: No atrial level shunt detected by color flow Doppler. Agitated saline contrast was given intravenously to evaluate for intracardiac shunting. Agitated saline contrast bubble study was negative, with no evidence of any interatrial shunt. Additional Comments: 3D was performed not requiring image post processing on an independent workstation and was indeterminate.  LEFT VENTRICLE PLAX 2D LVIDd:         3.50 cm   Diastology LVIDs:         2.40 cm   LV e' medial:    9.25 cm/s LV PW:         1.30 cm   LV E/e' medial:  6.0 LV IVS:         1.30 cm   LV e' lateral:   7.83 cm/s LVOT diam:     2.10 cm   LV E/e' lateral: 7.1 LV SV:         56 LV SV Index:   28 LVOT Area:     3.46 cm  RIGHT VENTRICLE RV S prime:     13.10 cm/s TAPSE (M-mode): 2.1 cm LEFT ATRIUM             Index        RIGHT ATRIUM           Index LA diam:        3.60 cm 1.76 cm/m   RA Area:     12.10 cm LA Vol (A2C):   31.9 ml 15.62 ml/m  RA Volume:   25.90 ml  12.68 ml/m LA Vol (A4C):   33.0 ml 16.16 ml/m LA Biplane Vol: 32.3 ml 15.82 ml/m  AORTIC VALVE LVOT Vmax:   90.20 cm/s LVOT Vmean:  66.600 cm/s LVOT VTI:    0.163 m  AORTA Ao Root diam: 3.20 cm Ao Asc diam:  3.10 cm MITRAL VALVE MV Area (PHT): 2.42 cm    SHUNTS MV Decel Time: 313 msec    Systemic VTI:  0.16 m MV E velocity: 55.50 cm/s  Systemic Diam: 2.10 cm MV A velocity: 66.00 cm/s MV E/A ratio:  0.84 Jayson Sierras MD Electronically signed by Jayson Sierras MD Signature Date/Time: 01/06/2024/4:57:59 PM    Final    DG CHEST PORT 1 VIEW Result Date: 01/05/2024 CLINICAL DATA:  Cough.  Aphasia. EXAM: PORTABLE CHEST 1 VIEW COMPARISON:  10/17/2017 FINDINGS: Shallow inspiration. Heart size and pulmonary vascularity are normal. Lungs are clear. No pleural effusion or pneumothorax. Mediastinal contours appear intact. Calcification of the aorta. Degenerative changes in the spine. IMPRESSION: No active disease. Electronically Signed   By: Elsie Gravely M.D.   On: 01/05/2024 22:07   CT ANGIO HEAD NECK W WO CM Result Date: 01/05/2024 CLINICAL DATA:  Initial evaluation for acute stroke. EXAM: CT ANGIOGRAPHY HEAD AND NECK WITH AND WITHOUT CONTRAST TECHNIQUE: Multidetector CT imaging of the head and neck was  performed using the standard protocol during bolus administration of intravenous contrast. Multiplanar CT image reconstructions and MIPs were obtained to evaluate the vascular anatomy. Carotid stenosis measurements (when applicable) are obtained utilizing NASCET criteria, using the distal internal carotid diameter as the  denominator. RADIATION DOSE REDUCTION: This exam was performed according to the departmental dose-optimization program which includes automated exposure control, adjustment of the mA and/or kV according to patient size and/or use of iterative reconstruction technique. CONTRAST:  75mL OMNIPAQUE  IOHEXOL  350 MG/ML SOLN COMPARISON: Prior CT and MRI from earlier the same day. FINDINGS: CTA NECK FINDINGS Aortic arch: Visualized aortic arch within normal limits for caliber with standard branch pattern. Aortic atherosclerosis. No significant stenosis about the origin the great vessels. Right carotid system: Right common and internal carotid arteries are patent without dissection. Minimal atheromatous change about the right carotid bulb without stenosis. Left carotid system: Left common and internal carotid arteries are patent without dissection. Mild atheromatous change about the left carotid bulb without stenosis. Vertebral arteries: Left vertebral artery arises directly from the aortic arch. Right vertebral artery dominant. Vertebral arteries are patent without stenosis or dissection. Skeleton: No discrete or worrisome osseous lesions. Other neck: No other acute finding. Upper chest: No other acute finding. Review of the MIP images confirms the above findings CTA HEAD FINDINGS Anterior circulation: Atheromatous change about the carotid siphons without hemodynamically significant stenosis. A1 segments patent bilaterally. Normal anterior communicating artery complex. Anterior cerebral arteries patent without significant stenosis. No M1 stenosis or occlusion. Distal MCA branches perfused and symmetric. Posterior circulation: Both V4 segments patent without significant stenosis. Both PICA patent. Basilar patent without stenosis. Superior cerebellar and posterior cerebral arteries patent bilaterally. Venous sinuses: Grossly patent allowing for timing the contrast bolus. Anatomic variants: As above.  No aneurysm. Review of the  MIP images confirms the above findings IMPRESSION: 1. Negative CTA for large vessel occlusion or other emergent finding. 2. Mild atheromatous change about the carotid bifurcations and carotid siphons without hemodynamically significant stenosis. 3.  Aortic Atherosclerosis (ICD10-I70.0). Electronically Signed   By: Morene Hoard M.D.   On: 01/05/2024 21:42   MR BRAIN WO CONTRAST Result Date: 01/05/2024 CLINICAL DATA:  Initial evaluation for acute TIA. EXAM: MRI HEAD WITHOUT CONTRAST TECHNIQUE: Multiplanar, multiecho pulse sequences of the brain and surrounding structures were obtained without intravenous contrast. COMPARISON:  Prior CT from earlier the same day. FINDINGS: Brain: Cerebral volume within normal limits. Patchy T2/FLAIR hyperintensity involving the supratentorial cerebral white matter, consistent with chronic small vessel ischemic disease, moderate in nature. 7 mm focus of diffusion signal abnormality seen involving the subcortical posterior right frontal lobe (series 5, image 87), consistent with a small acute ischemic infarct. No associated hemorrhage or mass effect. No other evidence for acute or subacute ischemia. No acute or chronic intracranial blood products. No mass lesion, midline shift or mass effect. No hydrocephalus or extra-axial fluid collection. Pituitary gland within normal limits. Vascular: Major intracranial vascular flow voids are maintained. Skull and upper cervical spine: Craniocervical junction within normal limits. Bone marrow signal intensity normal. No scalp soft tissue abnormality. Sinuses/Orbits: Globes orbital soft tissues within normal limits. Mild-to-moderate mucosal thickening about the ethmoidal air cells and right maxillary sinus. 12 mm T2 hyperintense lesion within the right nasal cavity, nonspecific, but could potentially reflect a polyp (series 10, image 4). No mastoid effusion. Other: None. IMPRESSION: 1. 7 mm acute ischemic nonhemorrhagic subcortical  posterior right frontal lobe infarct. 2. Underlying moderate chronic microvascular ischemic disease. 3. 12 mm T2 hyperintense  lesion within the right nasal cavity, nonspecific, but could potentially reflect a polyp. Correlation with physical exam suggested. Electronically Signed   By: Morene Hoard M.D.   On: 01/05/2024 21:18   CT HEAD CODE STROKE WO CONTRAST Result Date: 01/05/2024 CLINICAL DATA:  Code stroke.  Neuro deficit, concern for stroke. EXAM: CT HEAD WITHOUT CONTRAST TECHNIQUE: Contiguous axial images were obtained from the base of the skull through the vertex without intravenous contrast. RADIATION DOSE REDUCTION: This exam was performed according to the departmental dose-optimization program which includes automated exposure control, adjustment of the mA and/or kV according to patient size and/or use of iterative reconstruction technique. COMPARISON:  None Available. FINDINGS: Brain: No acute intracranial hemorrhage. No CT evidence of acute infarct. Nonspecific hypoattenuation in the periventricular and subcortical white matter favored to reflect chronic microvascular ischemic changes. No edema, mass effect, or midline shift. The basilar cisterns are patent. Ventricles: The ventricles are normal. Vascular: Atherosclerotic calcifications of the carotid siphons. No hyperdense vessel. Skull: No acute or aggressive finding. Orbits: Orbits are symmetric. Sinuses: Mucosal thickening in the right maxillary sinus. Additional mild mucosal thickening in the ethmoid sinuses, right greater than left. Other: Mastoid air cells are clear. ASPECTS [facility] Stroke Program Early CT Score) - Ganglionic level infarction (caudate, lentiform nuclei, internal capsule, insula, M1-M3 cortex): 7 - Supraganglionic infarction (M4-M6 cortex): 3 Total score (0-10 with 10 being normal): 10 IMPRESSION: 1. No CT evidence of acute intracranial abnormality. 2. Chronic microvascular ischemic changes. 3. ASPECTS is 10 These results  were communicated to Dr. Vanessa at 7:05 pm on 01/05/2024 by text page via the [facility] messaging system. Electronically Signed   By: Donnice Mania M.D.   On: 01/05/2024 19:06     PHYSICAL EXAM  Temp:  [97.5 F (36.4 C)-99.1 F (37.3 C)] 99.1 F (37.3 C) (07/05 1931) Pulse Rate:  [81-91] 91 (07/05 1931) Resp:  [17-19] 19 (07/05 1931) BP: (143-171)/(91-100) 152/99 (07/05 1931) SpO2:  [97 %-100 %] 97 % (07/05 1931)  General - Well nourished, well developed, in no apparent distress.  Ophthalmologic - fundi not visualized due to noncooperation.  Cardiovascular - Regular rhythm and rate.  Mental Status -  Level of arousal and orientation to time, place, and person were intact. Language was assessed and found to have mild expressive aphasia with paraphasic errors and intermittent hesitancy of speech. Naming 3/4 and able to repeat simple sentence but not complex sentences. Slight receptive aphasia.  Attention span and concentration were impaired. Fund of Knowledge was assessed and was intact.  Cranial Nerves II - XII - II - Visual field intact OU. III, IV, VI - Extraocular movements intact. V - Facial sensation intact bilaterally. VII - slight right facial droop VIII - Hearing & vestibular intact bilaterally. X - Palate elevates symmetrically. XI - Chin turning & shoulder shrug intact bilaterally. XII - Tongue protrusion intact.  Motor Strength - The patient's strength was normal in all extremities and pronator drift was absent.  Bulk was normal and fasciculations were absent.   Motor Tone - Muscle tone was assessed at the neck and appendages and was normal.  Reflexes - The patient's reflexes were symmetrical in all extremities and he had no pathological reflexes.  Sensory - Light touch, temperature/pinprick were assessed and were symmetrical.    Coordination - The patient had normal movements in the hands and feet with no ataxia or dysmetria.  Tremor was absent.  Gait and  Station - deferred.   ASSESSMENT/PLAN Mr. ELION HOCKER  is a 63 y.o. male with history of diabetes, hypertension, hyperlipidemia, kidney stone admitted for slurred speech, aphasia.  Improved on arrival.  No TNK given due to mild symptoms.    Stroke:  right frontal SO small infarct, likely secondary to small vessel disease source.  However, patient symptoms indicating left hemisphere infarct, will repeat MRI CT no acute abnormality CT head and neck bilateral ICA bulb and siphon atherosclerosis but no significant stenosis MRI right semiovale small infarct Repeat MRI pending 2D Echo EF 60 to 65%, no PFO LE venous Doppler pending May consider CardioNet monitoring versus loop recorder LDL 117 HgbA1c 12.1 UDS positive for phentermine (home medication) Lovenox  for VTE prophylaxis aspirin  81 mg daily prior to admission, now on aspirin  81 mg daily and clopidogrel  75 mg daily DAPT for 3 weeks and then Plavix  alone. Patient counseled to be compliant with his antithrombotic medications Ongoing aggressive stroke risk factor management Therapy recommendations: Outpatient OT Disposition: Pending  Diabetes HgbA1c 12.1 goal < 7.0 Uncontrolled Currently on insulin  CBG monitoring SSI DM education and close PCP follow up  Hypertension Stable Long term BP goal normotensive  Hyperlipidemia Home meds: Lipitor  80, questionable compliance LDL 117, goal < 70 Now on Lipitor  80 Continue statin at discharge  Other Stroke Risk Factors Advanced age  Other Active Problems Kidney stone  Hospital day # 0    Ary Cummins, MD PhD Stroke Neurology 01/06/2024 11:14 PM    To contact Stroke Continuity provider, please refer to [email or web address]. After hours, contact General Neurology

## 2024-01-06 NOTE — Inpatient Diabetes Management (Incomplete)
 Inpatient Diabetes Program Recommendations  AACE/ADA: New Consensus Statement on Inpatient Glycemic Control (2015)  Target Ranges:  Prepandial:   less than 140 mg/dL      Peak postprandial:   less than 180 mg/dL (1-2 hours)      Critically ill patients:  140 - 180 mg/dL   Lab Results  Component Value Date   GLUCAP 164 (H) 01/06/2024   HGBA1C 12.1 (H) 01/06/2024    Review of Glycemic Control  Diabetes history: DM 2 Outpatient Diabetes medications: Amaryl 2 mg Daily Current orders for Inpatient glycemic control:  Semglee  7 units  Novolog  0-6 units tid + hs  A1c 12.1% on 7/5 Consult High A1c >10%, New insulin , possible CGM use  Inpatient Diabetes Program Recommendations:

## 2024-01-06 NOTE — Progress Notes (Signed)
 Echocardiogram 2D Echocardiogram has been performed.  Charles Burton Jammy Stlouis RDCS 01/06/2024, 11:40 AM

## 2024-01-06 NOTE — Plan of Care (Signed)

## 2024-01-06 NOTE — Inpatient Diabetes Management (Signed)
 Inpatient Diabetes Program Recommendations  AACE/ADA: New Consensus Statement on Inpatient Glycemic Control (2015)  Target Ranges:  Prepandial:   less than 140 mg/dL      Peak postprandial:   less than 180 mg/dL (1-2 hours)      Critically ill patients:  140 - 180 mg/dL   Lab Results  Component Value Date   GLUCAP 196 (H) 01/06/2024   HGBA1C 12.1 (H) 01/06/2024    Review of Glycemic Control  Diabetes history: DM 2 Outpatient Diabetes medications: metformin 1000 mg bid, Amaryl 2 mg Daily Current orders for Inpatient glycemic control:  Semglee  7 units  Novolog  0-6 units tid + hs  A1c 12.1% on 7/5  Inpatient Diabetes Program Recommendations:    Discharge Recommendations: Other recommendations: metformin 1000 mg bid per home dose, Amaryl 2 mg Daily per home dose. Will need to inquire about CGM with PCP, unable to run benefits check and apply to pt over the weekend. Long acting recommendations: Insulin  Glargine (LANTUS ) Solostar Pen dose TBD by MD day of d/c  Supply/Referral recommendations: Pen needles - standard   Use Adult Diabetes Insulin  Treatment Post Discharge order set.  Spoke with pt over the phone as DM coordinator is working remotely over the weekend. Pt reports having times where he misses doses of his medications and he checks his glucose 2-3 times a week. Spoke with pt about his A1c level, Discussed glucose and A1c goals. Walked him through the steps of operating insulin  pen, sent him video of an insulin  pen operation. Unable to run benefits check on CGM and discussed with pt to get his PCP to inquire about it for home on Monday. Looks like Lantus  solostar insulin  pen is preferred under his insurance.   Thanks,  Clotilda Bull RN, MSN, BC-ADM Inpatient Diabetes Coordinator Team Pager [phone] (8a-5p)

## 2024-01-06 NOTE — Evaluation (Signed)
 Occupational Therapy Evaluation Patient Details Name: Charles Burton MRN: [id number] DOB: Apr 08, 1961 Today's Date: 01/06/2024   History of Present Illness   Pt is a 63 yo male presenting to [facility] on 01/05/24 as a code stroke following episode of slurred speech. 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 PMH of GERD, DM, HLD, HTN, anxiety.     Clinical Impressions Pt admitted for above, PTA pt reports being ind in his ADLs/iADLs and driving. Pt currently remains ind with ADLs and mobility, ambulating halls no AD without any notable deficits in balance on eval. Performed some cognitive screens and noticed that pt has impaired STM/working memory and needs increased time for processing. Pt would benefit from continued work in acute skilled OT services to address listed memory deficits and teach compensatory strategies. Discussed having assist with medication management at DC, pt and family in agreeance with that. Recommend oupt OT services if pt DC prior to further work with cognition.      If plan is discharge home, recommend the following:   Assist for transportation;Assistance with cooking/housework;Direct supervision/assist for medications management     Functional Status Assessment   Patient has had a recent decline in their functional status and demonstrates the ability to make significant improvements in function in a reasonable and predictable amount of time.     Equipment Recommendations   None recommended by OT     Recommendations for Other Services   Speech consult     Precautions/Restrictions   Precautions Precautions: Fall (low fall risk) Recall of Precautions/Restrictions: Intact Restrictions Weight Bearing Restrictions Per Provider Order: No     Mobility Bed Mobility Overal bed mobility: Independent                  Transfers Overall transfer level: Independent Equipment used: None                       Balance Overall balance assessment: Mild deficits observed, not formally tested                                         ADL either performed or assessed with clinical judgement   ADL Overall ADL's : Independent                                       General ADL Comments: Pt ind wtih mobility and ADLs, ambulating hall no AD with appropriate control of balance during head turn and pace changes.     Vision Patient Visual Report: No change from baseline Vision Assessment?: Yes Eye Alignment: Within Functional Limits Ocular Range of Motion: Within Functional Limits Alignment/Gaze Preference: Within Defined Limits Tracking/Visual Pursuits: Able to track stimulus in all quads without difficulty Saccades: Within functional limits Convergence: Within functional limits     Perception Perception: Within Functional Limits       Praxis Praxis: [facility]       Pertinent Vitals/Pain Pain Assessment Pain Assessment: Faces Faces Pain Scale: Hurts little more Pain Location: L sided headache Pain Descriptors / Indicators: Discomfort, Headache Pain Intervention(s): Monitored during session     Extremity/Trunk Assessment Upper Extremity Assessment Upper Extremity Assessment: Overall WFL for tasks assessed (5/5 MMT throughout bilat, coordination intact, sensation WNL)   Lower Extremity Assessment Lower Extremity Assessment: Overall [facility]  for tasks assessed (5/5 MMT throughout bilat, coordination intact, sensation WNL)   Cervical / Trunk Assessment Cervical / Trunk Assessment: Normal   Communication Communication Communication: No apparent difficulties   Cognition Arousal: Alert Behavior During Therapy: WFL for tasks assessed/performed Cognition: Cognition impaired       Memory impairment (select all impairments): Working [employer] fast streamer, Short-term memory Attention impairment (select first level of impairment): Sustained attention   OT - Cognition Comments:  Performed mini cog assessment, pt with 0/3 on the STM word recall needing cues to locate all 3 words. During clock draw pt continued to misplace his 11 but he would fix it himself despite his mistakes. Unable to set clock hands to appropriate times, Pt able to idenify his errors but has trouble correcting them                 Following commands: Intact       Cueing  General Comments   Cueing Techniques: Verbal cues  HR 80s-90s with activity   Exercises     Shoulder Instructions      Home Living Family/patient expects to be discharged to:: Private residence Living Arrangements: Children (daughter) Available Help at Discharge: Family;Available 24 hours/day Type of Home: House Home Access: Stairs to enter [employer] of Steps: 2-3 Entrance Stairs-Rails: None Home Layout: One level     Bathroom Shower/Tub: Producer, television/film/video: Standard     Home Equipment: None          Prior Functioning/Environment Prior Level of Function : Independent/Modified Independent;Driving             Mobility Comments: ind no AD ADLs Comments: ind    OT Problem List: Decreased cognition   OT Treatment/Interventions: Cognitive remediation/compensation      OT Goals(Current goals can be found in the care plan section)   Acute Rehab OT Goals Patient Stated Goal: TO go home OT Goal Formulation: With patient Time For Goal Achievement: 01/20/24 Potential to Achieve Goals: Good ADL Goals Additional ADL Goal #1: Pt will score WNL on the pill box assesment to demonstrate ability to ind manage medications. Additional ADL Goal #2: Pt will verbalize ind understanding of compensatory strategies for STM deficits.   OT Frequency:  Min 1X/week    Co-evaluation              AM-PAC OT 6 Clicks Daily Activity     Outcome Measure Help from another person eating meals?: None Help from another person taking care of personal grooming?: None Help from  another person toileting, which includes using toliet, bedpan, or urinal?: None Help from another person bathing (including washing, rinsing, drying)?: None Help from another person to put on and taking off regular upper body clothing?: None Help from another person to put on and taking off regular lower body clothing?: None 6 Click Score: 24   End of Session Equipment Utilized During Treatment: Gait belt Nurse Communication: Mobility status  Activity Tolerance: Patient tolerated treatment well Patient left: with call bell/phone within reach;in bed;with family/visitor present  OT Visit Diagnosis: Other symptoms and signs involving cognitive function;Cognitive communication deficit (R41.841) Symptoms and signs involving cognitive functions:  (awaiting second MRI results)                Time: 1241-1313 OT Time Calculation (min): 32 min Charges:  OT General Charges $OT Visit: 1 Visit OT Evaluation $OT Eval Low Complexity: 1 Low OT Treatments $Cognitive Funtion inital: Initial 15 mins  01/06/2024  AB,  OTR/L  Acute Rehabilitation Services  Office: [phone]   Curtistine JONETTA Das 01/06/2024, 5:14 PM

## 2024-01-06 NOTE — Plan of Care (Signed)
  Problem: Activity: Goal: Risk for activity intolerance will decrease Outcome: Progressing   Problem: Pain Managment: Goal: General experience of comfort will improve and/or be controlled Outcome: Progressing   Problem: Safety: Goal: Ability to remain free from injury will improve Outcome: Progressing   Problem: Skin Integrity: Goal: Risk for impaired skin integrity will decrease Outcome: Progressing

## 2024-01-06 NOTE — Progress Notes (Signed)
 PT Cancellation/Sign Off Note  Patient Details Name: Charles Burton MRN: [id number] DOB: [birth year]   Cancelled Treatment:    Reason Eval/Treat Not Completed: PT screened, no needs identified, will sign off. Discussed with OT that completed evaluation. Pt is at baseline level for mobility and no acute PT needs identified.  Will sign off. If pt needs were to change please re-consult PT. Thank you, Oneil Schick, PT   Acute Rehabilitation Services  Office [phone] 01/06/2024    Schick Oneil PARAS 01/06/2024, 1:31 PM

## 2024-01-07 ENCOUNTER — Inpatient Hospital Stay (HOSPITAL_COMMUNITY)

## 2024-01-07 DIAGNOSIS — I6340 Cerebral infarction due to embolism of unspecified cerebral artery: Secondary | ICD-10-CM

## 2024-01-07 DIAGNOSIS — E1151 Type 2 diabetes mellitus with diabetic peripheral angiopathy without gangrene: Secondary | ICD-10-CM | POA: Diagnosis not present

## 2024-01-07 DIAGNOSIS — E785 Hyperlipidemia, unspecified: Secondary | ICD-10-CM | POA: Diagnosis not present

## 2024-01-07 DIAGNOSIS — I639 Cerebral infarction, unspecified: Secondary | ICD-10-CM | POA: Diagnosis not present

## 2024-01-07 DIAGNOSIS — E1165 Type 2 diabetes mellitus with hyperglycemia: Secondary | ICD-10-CM | POA: Diagnosis not present

## 2024-01-07 LAB — GLUCOSE, CAPILLARY
Glucose-Capillary: 153 mg/dL — ABNORMAL HIGH (ref 70–99)
Glucose-Capillary: 312 mg/dL — ABNORMAL HIGH (ref 70–99)
Glucose-Capillary: 206 mg/dL — ABNORMAL HIGH (ref 70–99)
Glucose-Capillary: 251 mg/dL — ABNORMAL HIGH (ref 70–99)

## 2024-01-07 NOTE — Progress Notes (Signed)
 Occupational Therapy Treatment Patient Details Name: Charles Burton MRN: [id number] DOB: 1961-03-25 Today's Date: 01/07/2024   History of present illness Pt is a 63 yo male presenting to [facility] on 01/05/24 as a code stroke following episode of slurred speech. 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 PMH of GERD, DM, HLD, HTN, anxiety.   OT comments  Pt supine in bed with family at side, agreeable to OT session. Functional cognition further assessed with The Pillbox Test: A Measure of Executive Functioning and Estimate of Medication Management. A straight pass/fail designation is determined by 3 or more errors of omission or misplacement on the task.  Pt made 28 errors on pill box test,  demonstrating deficits with planning, attention, mental flexibility, suboptimal search strategies, concrete thinking and difficulty with multitasking.  See below for details. Educated on techniques for short term memory, recommendations and safety.  At this time recommend assist for medication and fiances, driving, and cooking, pt and daughter voice understanding. Continue to recommend outpatient neuro OT at dc.  Will follow acutely.        If plan is discharge home, recommend the following:  Assist for transportation;Assistance with cooking/housework;Direct supervision/assist for medications management;Supervision due to cognitive status;Direct supervision/assist for financial management   Equipment Recommendations  None recommended by OT    Recommendations for Other Services Speech consult    Precautions / Restrictions Precautions Precautions: Fall Recall of Precautions/Restrictions: Intact Restrictions Weight Bearing Restrictions Per Provider Order: No       Mobility Bed Mobility Overal bed mobility: Independent                  Transfers                         Balance                                           ADL either performed  or assessed with clinical judgement   ADL                                         General ADL Comments: focused on cognition    Extremity/Trunk Assessment              Vision       Perception     Praxis     Communication     Cognition Arousal: Alert Behavior During Therapy: WFL for tasks assessed/performed Cognition: Cognition impaired     Awareness: Online awareness impaired Memory impairment (select all impairments): Working memory Attention impairment (select first level of impairment): Sustained attention Executive functioning impairment (select all impairments): Organization, Sequencing, Reasoning, Problem solving OT - Cognition Comments: pt complete pill box test, see below for details    Functional cognition further assessed with The Pillbox Test: A Measure of Executive Functioning and Estimate of Medication Management. A straight pass/fail designation is determined by 3 or more errors of omission or misplacement on the task. Pt had a total of 28 errors.and failed the assessment, demonstrating deficits with planning, mental flexibility, suboptimal search strategies, concrete thinking and difficulty with multitasking.  Errors: One tablet 3x/day - 14 errors (omission/misplacement) One tablet 2x/day with breakfast and dinner - 7 errors (omission/misplacement) One tablet in the morning -  0 errors (omission/misplacement) One tablet daily at bedtime  - 7 errors(omission/misplacement) One tablet every other day - 0 errors (omission/misplacement)    Total time to complete task  - 3 min  Pt able to correctly read pill bottles prior to test beginning.  He placed all medications in single compartment, with decreased awareness to why the pills were not fitting and with poor attention to detail of the labels on the pill box and the medication bottles.  Once errors were pointed out, pt able to correct placement of pills into the pill box but was unable to  correct the 2x/day pill ( as he was still only reading part of the label).              Following commands: Intact        Cueing      Exercises      Shoulder Instructions       General Comments      Pertinent Vitals/ Pain       Pain Assessment Pain Assessment: Faces Faces Pain Scale: No hurt Pain Intervention(s): Monitored during session  Home Living                                          Prior Functioning/Environment              Frequency  Min 2X/week        Progress Toward Goals  OT Goals(current goals can now be found in the care plan section)  Progress towards OT goals: Progressing toward goals  Acute Rehab OT Goals Patient Stated Goal: home OT Goal Formulation: With patient Time For Goal Achievement: 01/20/24 Potential to Achieve Goals: Good  Plan      Co-evaluation                 AM-PAC OT 6 Clicks Daily Activity     Outcome Measure   Help from another person eating meals?: None Help from another person taking care of personal grooming?: None Help from another person toileting, which includes using toliet, bedpan, or urinal?: None Help from another person bathing (including washing, rinsing, drying)?: None Help from another person to put on and taking off regular upper body clothing?: None Help from another person to put on and taking off regular lower body clothing?: None 6 Click Score: 24    End of Session    OT Visit Diagnosis: Other symptoms and signs involving cognitive function;Cognitive communication deficit (R41.841) Symptoms and signs involving cognitive functions: Cerebral infarction   Activity Tolerance Patient tolerated treatment well   Patient Left with call bell/phone within reach;in bed;with family/visitor present   Nurse Communication Mobility status        Time: 1252-1310 OT Time Calculation (min): 18 min  Charges: OT General Charges $OT Visit: 1 Visit OT Treatments $Self  Care/Home Management : 8-22 mins  Etta NOVAK, OT Acute Rehabilitation Services Office [phone] Secure Chat Preferred    Etta GORMAN Hope 01/07/2024, 2:08 PM

## 2024-01-07 NOTE — Progress Notes (Signed)
 STROKE TEAM PROGRESS NOTE   SUBJECTIVE (INTERVAL HISTORY) His son and daughter are at the bedside.  He is speech much improved overnight, near baseline.  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  Given bilateral small infarcts, recommend loop recorder.   OBJECTIVE Temp:  [97.9 F (36.6 C)-99.1 F (37.3 C)] 98.5 F (36.9 C) (07/06 1639) Pulse Rate:  [73-91] 78 (07/06 1639) Cardiac Rhythm: Normal sinus rhythm (07/06 0742) Resp:  [18-20] 18 (07/06 1639) BP: (137-157)/(91-104) 139/98 (07/06 1639) SpO2:  [97 %-100 %] 97 % (07/06 1639)  Recent Labs  Lab 01/06/24 1632 01/06/24 2156 01/07/24 0559 01/07/24 1159 01/07/24 1647  GLUCAP 157* 190* 153* 312* 206*   Recent Labs  Lab 01/05/24 1846 01/05/24 1850 01/06/24 0513  NA 139 138 138  K 3.8 3.8 3.5  CL 103 102 107  CO2 24  --  24  GLUCOSE 227* 224* 188*  BUN 13 16 13   CREATININE 0.97 0.90 0.90  CALCIUM  9.1  --  8.8*  MG  --   --  1.8  PHOS  --   --  2.6   Recent Labs  Lab 01/05/24 1846  AST 20  ALT 18  ALKPHOS 64  BILITOT 0.8  PROT 7.0  ALBUMIN 3.6   Recent Labs  Lab 01/05/24 1846 01/05/24 1850 01/06/24 0513  WBC 8.0  --  5.9  NEUTROABS 5.4  --   --   HGB 15.6 16.3 14.4  HCT 46.2 48.0 42.7  MCV 85.1  --  82.8  PLT 221  --  203   No results for input(s): CKTOTAL, CKMB, CKMBINDEX, TROPONINI in the last 168 hours. Recent Labs    01/05/24 1846  LABPROT 13.6  INR 1.0   No results for input(s): COLORURINE, LABSPEC, PHURINE, GLUCOSEU, HGBUR, BILIRUBINUR, KETONESUR, PROTEINUR, UROBILINOGEN, NITRITE, LEUKOCYTESUR in the last 72 hours.  Invalid input(s): APPERANCEUR     Component Value Date/Time   CHOL 184 01/06/2024 0514   TRIG 135 01/06/2024 0514   HDL 40 (L) 01/06/2024 0514   CHOLHDL 4.6 01/06/2024 0514   VLDL 27 01/06/2024 0514   LDLCALC 117 (H) 01/06/2024 0514   Lab Results  Component Value Date   HGBA1C 12.1 (H) 01/06/2024      Component Value Date/Time    LABOPIA NONE DETECTED 01/05/2024 0819   COCAINSCRNUR NONE DETECTED 01/05/2024 0819   LABBENZ NONE DETECTED 01/05/2024 0819   AMPHETMU POSITIVE (A) 01/05/2024 0819   THCU NONE DETECTED 01/05/2024 0819   LABBARB NONE DETECTED 01/05/2024 0819    Recent Labs  Lab 01/05/24 1846  ETH <15    I have personally reviewed the radiological images below and agree with the radiology interpretations.  VAS US  LOWER EXTREMITY VENOUS (DVT) Result Date: 01/07/2024  Lower Venous DVT Study Patient Name:  TYQUAN CARMICKLE  Date of Exam:   01/07/2024 Medical Rec #: [id number]       Accession #:    [id number] Date of Birth: [birth year]       Patient Gender: M Patient Age:   63 years Exam Location:  [facility] Procedure:      VAS US  LOWER EXTREMITY VENOUS (DVT) Referring Phys: ARY Krisanne Lich --------------------------------------------------------------------------------  Indications: Embolic stroke.  Comparison Study: No priors. Performing Technologist: Ricka Sturdivant-Jones RDMS, RVT  Examination Guidelines: A complete evaluation includes B-mode imaging, spectral Doppler, color Doppler, and power Doppler as needed of all accessible portions of each vessel. Bilateral testing is considered an integral part of a complete examination. Limited examinations  for reoccurring indications may be performed as noted. The reflux portion of the exam is performed with the patient in reverse Trendelenburg.  +---------+---------------+---------+-----------+----------+--------------+ RIGHT    CompressibilityPhasicitySpontaneityPropertiesThrombus Aging +---------+---------------+---------+-----------+----------+--------------+ CFV      Full           Yes      Yes                                 +---------+---------------+---------+-----------+----------+--------------+ SFJ      Full                                                        +---------+---------------+---------+-----------+----------+--------------+ FV Prox   Full                                                        +---------+---------------+---------+-----------+----------+--------------+ FV Mid   Full                                                        +---------+---------------+---------+-----------+----------+--------------+ FV DistalFull                                                        +---------+---------------+---------+-----------+----------+--------------+ PFV      Full                                                        +---------+---------------+---------+-----------+----------+--------------+ POP      Full           Yes      Yes                                 +---------+---------------+---------+-----------+----------+--------------+ PTV      Full                                                        +---------+---------------+---------+-----------+----------+--------------+ PERO     Full                                                        +---------+---------------+---------+-----------+----------+--------------+   +---------+---------------+---------+-----------+----------+--------------+ LEFT     CompressibilityPhasicitySpontaneityPropertiesThrombus Aging +---------+---------------+---------+-----------+----------+--------------+ CFV      Full  Yes      Yes                                 +---------+---------------+---------+-----------+----------+--------------+ SFJ      Full                                                        +---------+---------------+---------+-----------+----------+--------------+ FV Prox  Full                                                        +---------+---------------+---------+-----------+----------+--------------+ FV Mid   Full                                                        +---------+---------------+---------+-----------+----------+--------------+ FV DistalFull                                                         +---------+---------------+---------+-----------+----------+--------------+ PFV      Full                                                        +---------+---------------+---------+-----------+----------+--------------+ POP      Full           Yes      Yes                                 +---------+---------------+---------+-----------+----------+--------------+ PTV      Full                                                        +---------+---------------+---------+-----------+----------+--------------+ PERO     Full                                                        +---------+---------------+---------+-----------+----------+--------------+     Summary: BILATERAL: - No evidence of deep vein thrombosis seen in the lower extremities, bilaterally. -No evidence of popliteal cyst, bilaterally.   *See table(s) above for measurements and observations.    Preliminary    MR BRAIN WO CONTRAST Result Date: 01/06/2024 CLINICAL DATA:  Follow-up examination for stroke. EXAM: MRI HEAD WITHOUT CONTRAST TECHNIQUE: Multiplanar, multiecho pulse sequences of the brain and surrounding structures were  obtained without intravenous contrast. COMPARISON:  Prior studies from 01/05/2024 FINDINGS: Brain: Limited study with diffusion-weighted imaging only was performed. Previously identified 7 mm acute ischemic nonhemorrhagic infarct involving the subcortical posterior right frontal lobe again seen, stable. There is a new punctate acute ischemic infarct involving the contralateral left frontal cortex (series 2, image 38). No other evidence for new or interval infarction elsewhere. No other new abnormality seen on this limited exam. Vascular: Not well assessed on this limited exam. Skull and upper cervical spine: Not well assessed on this limited exam. Sinuses/Orbits: Not well assessed on this limited exam. Other: None. IMPRESSION: 1. New punctate acute ischemic nonhemorrhagic infarct  involving the left frontal cortex. 2. Stable 7 mm acute ischemic nonhemorrhagic subcortical posterior right frontal lobe infarct. Electronically Signed   By: Morene Hoard M.D.   On: 01/06/2024 17:45   ECHOCARDIOGRAM COMPLETE Result Date: 01/06/2024    ECHOCARDIOGRAM REPORT   Patient Name:   DREVON PLOG Date of Exam: 01/06/2024 Medical Rec #:  [id number]      Height:       70.0 in Accession #:    [id number]     Weight:       190.0 lb Date of Birth:  21-Apr-1961      BSA:          2.042 m Patient Age:    63 years       BP:           143/95 mmHg Patient Gender: M              HR:           80 bpm. Exam Location:  Inpatient Procedure: 2D Echo, Color Doppler, Cardiac Doppler and Saline Contrast Bubble            Study (Both Spectral and Color Flow Doppler were utilized during            procedure). Indications:    TIA  History:        Patient has no prior history of Echocardiogram examinations.                 Risk Factors:Hypertension, Diabetes, Dyslipidemia and Sleep                 Apnea.  Sonographer:    Damien Senior RDCS Referring Phys: [id number] DORN DAWSON IMPRESSIONS  1. Left ventricular ejection fraction, by estimation, is 60 to 65%. The left ventricle has normal function. The left ventricle has no regional wall motion abnormalities. There is mild concentric left ventricular hypertrophy. Left ventricular diastolic parameters are consistent with Grade I diastolic dysfunction (impaired relaxation).  2. Right ventricular systolic function is normal. The right ventricular size is normal. Tricuspid regurgitation signal is inadequate for assessing PA pressure.  3. Agitated saline contrast bubble study was negative, with no evidence of any interatrial shunt.  4. The mitral valve is grossly normal. Trivial mitral valve regurgitation.  5. The aortic valve is tricuspid. There is mild calcification of the aortic valve. Aortic valve regurgitation is not visualized. Aortic valve sclerosis is present, with no  evidence of aortic valve stenosis.  6. The inferior vena cava is normal in size with greater than 50% respiratory variability, suggesting right atrial pressure of 3 mmHg. Comparison(s): No prior Echocardiogram. FINDINGS  Left Ventricle: Left ventricular ejection fraction, by estimation, is 60 to 65%. The left ventricle has normal function. The left ventricle has no regional wall motion abnormalities. The left ventricular internal  cavity size was normal in size. There is  mild concentric left ventricular hypertrophy. Left ventricular diastolic parameters are consistent with Grade I diastolic dysfunction (impaired relaxation). Right Ventricle: The right ventricular size is normal. No increase in right ventricular wall thickness. Right ventricular systolic function is normal. Tricuspid regurgitation signal is inadequate for assessing PA pressure. Left Atrium: Left atrial size was normal in size. Right Atrium: Right atrial size was normal in size. Pericardium: There is no evidence of pericardial effusion. Mitral Valve: The mitral valve is grossly normal. Trivial mitral valve regurgitation. Tricuspid Valve: The tricuspid valve is grossly normal. Tricuspid valve regurgitation is trivial. Aortic Valve: The aortic valve is tricuspid. There is mild calcification of the aortic valve. There is mild aortic valve annular calcification. Aortic valve regurgitation is not visualized. Aortic valve sclerosis is present, with no evidence of aortic valve stenosis. Pulmonic Valve: The pulmonic valve was grossly normal. Pulmonic valve regurgitation is trivial. Aorta: The aortic root and ascending aorta are structurally normal, with no evidence of dilitation. Venous: The inferior vena cava is normal in size with greater than 50% respiratory variability, suggesting right atrial pressure of 3 mmHg. IAS/Shunts: No atrial level shunt detected by color flow Doppler. Agitated saline contrast was given intravenously to evaluate for intracardiac  shunting. Agitated saline contrast bubble study was negative, with no evidence of any interatrial shunt. Additional Comments: 3D was performed not requiring image post processing on an independent workstation and was indeterminate.  LEFT VENTRICLE PLAX 2D LVIDd:         3.50 cm   Diastology LVIDs:         2.40 cm   LV e' medial:    9.25 cm/s LV PW:         1.30 cm   LV E/e' medial:  6.0 LV IVS:        1.30 cm   LV e' lateral:   7.83 cm/s LVOT diam:     2.10 cm   LV E/e' lateral: 7.1 LV SV:         56 LV SV Index:   28 LVOT Area:     3.46 cm  RIGHT VENTRICLE RV S prime:     13.10 cm/s TAPSE (M-mode): 2.1 cm LEFT ATRIUM             Index        RIGHT ATRIUM           Index LA diam:        3.60 cm 1.76 cm/m   RA Area:     12.10 cm LA Vol (A2C):   31.9 ml 15.62 ml/m  RA Volume:   25.90 ml  12.68 ml/m LA Vol (A4C):   33.0 ml 16.16 ml/m LA Biplane Vol: 32.3 ml 15.82 ml/m  AORTIC VALVE LVOT Vmax:   90.20 cm/s LVOT Vmean:  66.600 cm/s LVOT VTI:    0.163 m  AORTA Ao Root diam: 3.20 cm Ao Asc diam:  3.10 cm MITRAL VALVE MV Area (PHT): 2.42 cm    SHUNTS MV Decel Time: 313 msec    Systemic VTI:  0.16 m MV E velocity: 55.50 cm/s  Systemic Diam: 2.10 cm MV A velocity: 66.00 cm/s MV E/A ratio:  0.84 Jayson Sierras MD Electronically signed by Jayson Sierras MD Signature Date/Time: 01/06/2024/4:57:59 PM    Final    DG CHEST PORT 1 VIEW Result Date: 01/05/2024 CLINICAL DATA:  Cough.  Aphasia. EXAM: PORTABLE CHEST 1 VIEW COMPARISON:  10/17/2017 FINDINGS: Shallow  inspiration. Heart size and pulmonary vascularity are normal. Lungs are clear. No pleural effusion or pneumothorax. Mediastinal contours appear intact. Calcification of the aorta. Degenerative changes in the spine. IMPRESSION: No active disease. Electronically Signed   By: Elsie Gravely M.D.   On: 01/05/2024 22:07   CT ANGIO HEAD NECK W WO CM Result Date: 01/05/2024 CLINICAL DATA:  Initial evaluation for acute stroke. EXAM: CT ANGIOGRAPHY HEAD AND NECK WITH AND  WITHOUT CONTRAST TECHNIQUE: Multidetector CT imaging of the head and neck was performed using the standard protocol during bolus administration of intravenous contrast. Multiplanar CT image reconstructions and MIPs were obtained to evaluate the vascular anatomy. Carotid stenosis measurements (when applicable) are obtained utilizing NASCET criteria, using the distal internal carotid diameter as the denominator. RADIATION DOSE REDUCTION: This exam was performed according to the departmental dose-optimization program which includes automated exposure control, adjustment of the mA and/or kV according to patient size and/or use of iterative reconstruction technique. CONTRAST:  75mL OMNIPAQUE  IOHEXOL  350 MG/ML SOLN COMPARISON: Prior CT and MRI from earlier the same day. FINDINGS: CTA NECK FINDINGS Aortic arch: Visualized aortic arch within normal limits for caliber with standard branch pattern. Aortic atherosclerosis. No significant stenosis about the origin the great vessels. Right carotid system: Right common and internal carotid arteries are patent without dissection. Minimal atheromatous change about the right carotid bulb without stenosis. Left carotid system: Left common and internal carotid arteries are patent without dissection. Mild atheromatous change about the left carotid bulb without stenosis. Vertebral arteries: Left vertebral artery arises directly from the aortic arch. Right vertebral artery dominant. Vertebral arteries are patent without stenosis or dissection. Skeleton: No discrete or worrisome osseous lesions. Other neck: No other acute finding. Upper chest: No other acute finding. Review of the MIP images confirms the above findings CTA HEAD FINDINGS Anterior circulation: Atheromatous change about the carotid siphons without hemodynamically significant stenosis. A1 segments patent bilaterally. Normal anterior communicating artery complex. Anterior cerebral arteries patent without significant stenosis.  No M1 stenosis or occlusion. Distal MCA branches perfused and symmetric. Posterior circulation: Both V4 segments patent without significant stenosis. Both PICA patent. Basilar patent without stenosis. Superior cerebellar and posterior cerebral arteries patent bilaterally. Venous sinuses: Grossly patent allowing for timing the contrast bolus. Anatomic variants: As above.  No aneurysm. Review of the MIP images confirms the above findings IMPRESSION: 1. Negative CTA for large vessel occlusion or other emergent finding. 2. Mild atheromatous change about the carotid bifurcations and carotid siphons without hemodynamically significant stenosis. 3.  Aortic Atherosclerosis (ICD10-I70.0). Electronically Signed   By: Morene Hoard M.D.   On: 01/05/2024 21:42   MR BRAIN WO CONTRAST Result Date: 01/05/2024 CLINICAL DATA:  Initial evaluation for acute TIA. EXAM: MRI HEAD WITHOUT CONTRAST TECHNIQUE: Multiplanar, multiecho pulse sequences of the brain and surrounding structures were obtained without intravenous contrast. COMPARISON:  Prior CT from earlier the same day. FINDINGS: Brain: Cerebral volume within normal limits. Patchy T2/FLAIR hyperintensity involving the supratentorial cerebral white matter, consistent with chronic small vessel ischemic disease, moderate in nature. 7 mm focus of diffusion signal abnormality seen involving the subcortical posterior right frontal lobe (series 5, image 87), consistent with a small acute ischemic infarct. No associated hemorrhage or mass effect. No other evidence for acute or subacute ischemia. No acute or chronic intracranial blood products. No mass lesion, midline shift or mass effect. No hydrocephalus or extra-axial fluid collection. Pituitary gland within normal limits. Vascular: Major intracranial vascular flow voids are maintained. Skull and upper cervical spine:  Craniocervical junction within normal limits. Bone marrow signal intensity normal. No scalp soft tissue  abnormality. Sinuses/Orbits: Globes orbital soft tissues within normal limits. Mild-to-moderate mucosal thickening about the ethmoidal air cells and right maxillary sinus. 12 mm T2 hyperintense lesion within the right nasal cavity, nonspecific, but could potentially reflect a polyp (series 10, image 4). No mastoid effusion. Other: None. IMPRESSION: 1. 7 mm acute ischemic nonhemorrhagic subcortical posterior right frontal lobe infarct. 2. Underlying moderate chronic microvascular ischemic disease. 3. 12 mm T2 hyperintense lesion within the right nasal cavity, nonspecific, but could potentially reflect a polyp. Correlation with physical exam suggested. Electronically Signed   By: Morene Hoard M.D.   On: 01/05/2024 21:18   CT HEAD CODE STROKE WO CONTRAST Result Date: 01/05/2024 CLINICAL DATA:  Code stroke.  Neuro deficit, concern for stroke. EXAM: CT HEAD WITHOUT CONTRAST TECHNIQUE: Contiguous axial images were obtained from the base of the skull through the vertex without intravenous contrast. RADIATION DOSE REDUCTION: This exam was performed according to the departmental dose-optimization program which includes automated exposure control, adjustment of the mA and/or kV according to patient size and/or use of iterative reconstruction technique. COMPARISON:  None Available. FINDINGS: Brain: No acute intracranial hemorrhage. No CT evidence of acute infarct. Nonspecific hypoattenuation in the periventricular and subcortical white matter favored to reflect chronic microvascular ischemic changes. No edema, mass effect, or midline shift. The basilar cisterns are patent. Ventricles: The ventricles are normal. Vascular: Atherosclerotic calcifications of the carotid siphons. No hyperdense vessel. Skull: No acute or aggressive finding. Orbits: Orbits are symmetric. Sinuses: Mucosal thickening in the right maxillary sinus. Additional mild mucosal thickening in the ethmoid sinuses, right greater than left. Other:  Mastoid air cells are clear. ASPECTS [facility] Stroke Program Early CT Score) - Ganglionic level infarction (caudate, lentiform nuclei, internal capsule, insula, M1-M3 cortex): 7 - Supraganglionic infarction (M4-M6 cortex): 3 Total score (0-10 with 10 being normal): 10 IMPRESSION: 1. No CT evidence of acute intracranial abnormality. 2. Chronic microvascular ischemic changes. 3. ASPECTS is 10 These results were communicated to Dr. Vanessa at 7:05 pm on 01/05/2024 by text page via the [facility] messaging system. Electronically Signed   By: Donnice Mania M.D.   On: 01/05/2024 19:06     PHYSICAL EXAM  Temp:  [97.9 F (36.6 C)-99.1 F (37.3 C)] 98.5 F (36.9 C) (07/06 1639) Pulse Rate:  [73-91] 78 (07/06 1639) Resp:  [18-20] 18 (07/06 1639) BP: (137-157)/(91-104) 139/98 (07/06 1639) SpO2:  [97 %-100 %] 97 % (07/06 1639)  General - Well nourished, well developed, in no apparent distress.  Ophthalmologic - fundi not visualized due to noncooperation.  Cardiovascular - Regular rhythm and rate.  Mental Status -  Level of arousal and orientation to time, place, and person were intact. Language including expression, naming, repetition, comprehension was assessed and found intact. Fund of Knowledge was assessed and was intact except occasional word finding difficulty.  Cranial Nerves II - XII - II - Visual field intact OU. III, IV, VI - Extraocular movements intact. V - Facial sensation intact bilaterally. VII - slight right facial droop VIII - Hearing & vestibular intact bilaterally. X - Palate elevates symmetrically. XI - Chin turning & shoulder shrug intact bilaterally. XII - Tongue protrusion intact.  Motor Strength - The patient's strength was normal in all extremities and pronator drift was absent.  Bulk was normal and fasciculations were absent.   Motor Tone - Muscle tone was assessed at the neck and appendages and was normal.  Reflexes -  The patient's reflexes were symmetrical in all  extremities and he had no pathological reflexes.  Sensory - Light touch, temperature/pinprick were assessed and were symmetrical.    Coordination - The patient had normal movements in the hands and feet with no ataxia or dysmetria.  Tremor was absent.  Gait and Station - deferred.   ASSESSMENT/PLAN Mr. Charles Burton is a 63 y.o. male with history of diabetes, hypertension, hyperlipidemia, kidney stone admitted for slurred speech, aphasia.  Improved on arrival.  No TNK given due to mild symptoms.    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 Stable 7 mm acute ischemic nonhemorrhagic subcortical posterior right frontal lobe infarct. 2D Echo EF 60 to 65%, no PFO LE venous Doppler no DVT May consider loop recorder in a.m. LDL 117 HgbA1c 12.1 UDS positive for phentermine (home medication) Lovenox  for VTE prophylaxis aspirin  81 mg daily prior to admission, now on aspirin  81 mg daily and clopidogrel  75 mg daily DAPT for 3 weeks and then Plavix  alone. Patient counseled to be compliant with his antithrombotic medications Ongoing aggressive stroke risk factor management Therapy recommendations: Outpatient OT Disposition: Pending  Diabetes HgbA1c 12.1 goal < 7.0 Uncontrolled Currently on insulin  CBG monitoring SSI DM education and close PCP follow up  Hypertension Stable Long term BP goal normotensive  Hyperlipidemia Home meds: Lipitor  80, questionable compliance LDL 117, goal < 70 Now on Lipitor  80 Continue statin at discharge  Other Stroke Risk Factors Advanced age  Other Active Problems Kidney stone  Hospital day # 1    Ary Cummins, MD PhD Stroke  Neurology 01/07/2024 5:39 PM    To contact Stroke Continuity provider, please refer to [email or web address]. After hours, contact General Neurology

## 2024-01-07 NOTE — Plan of Care (Signed)
   Problem: Education: Goal: Knowledge of disease or condition will improve Outcome: Progressing

## 2024-01-07 NOTE — Plan of Care (Signed)
  Problem: Health Behavior/Discharge Planning: Goal: Ability to manage health-related needs will improve Outcome: Progressing Goal: Goals will be collaboratively established with patient/family Outcome: Progressing   Problem: Nutrition: Goal: Risk of aspiration will decrease Outcome: Progressing   Problem: Activity: Goal: Risk for activity intolerance will decrease Outcome: Progressing   Problem: Pain Managment: Goal: General experience of comfort will improve and/or be controlled Outcome: Progressing   Problem: Safety: Goal: Ability to remain free from injury will improve Outcome: Progressing   Problem: Skin Integrity: Goal: Risk for impaired skin integrity will decrease Outcome: Progressing

## 2024-01-07 NOTE — Progress Notes (Signed)
 PROGRESS NOTE  Charles Burton FMW:[id number] DOB: 1961/02/28 DOA: 01/05/2024 PCP: System, Provider Not In   LOS: 1 day   Brief Narrative / Interim history: 63 year old male with DM2, HTN, HLD, OSA who comes into the hospital with strokelike symptoms, dysarthria, expressive aphasia and transient numbness in the right hand.  Neurology was consulted and he was admitted to the hospital  Subjective / 24h Interval events: Feeling well, very concerned about the second stroke  Assesement and Plan: Principal problem Acute CVA -patient presented to the hospital with concern for stroke.  Neurology consulted and followed patient while hospitalized.  An MRI of the brain showed 7 mm acute ischemic nonhemorrhagic subcortical posterior right frontal lobe infarct, but also underlying moderate chronic microvascular ischemic disease.  CT angiogram was negative for large vessel occlusion but it did show atherosclerosis.  Lipid panel showed an LDL of 117.  A1c was elevated 12.1.  A 2D echocardiogram was fairly unremarkable. - yesterday, neurology felt that his symptoms are not consistent with the location of the stroke, had repeat MRI which showed an additional punctate CVA. - LE dopplers for DVT pending -recommendations are for loop recorder tomorrow.   Active problems Diabetes mellitus, poorly controlled, with hyperglycemia -his A1c was found to be elevated at 12.1.  At home he is on glimepiride metformin and Ozempic, however he admits that he is not taking the medications as prescribed and is liberal with his diet.  Long discussion with the patient at bedside, given A1c above 10 I do recommend insulin , he is agreeable and will start once daily long-acting.  Recommend outpatient follow-up -continue to monitor CBGs  Hyperlipidemia-continue statin  Essential hypertension -allow permissive hypertension now  OSA-does not use CPAP   Scheduled Meds:  aspirin  EC  81 mg Oral Daily   atorvastatin   80 mg Oral QHS    clopidogrel   75 mg Oral Daily   enoxaparin  (LOVENOX ) injection  40 mg Subcutaneous Q24H   insulin  aspart  0-5 Units Subcutaneous QHS   insulin  aspart  0-6 Units Subcutaneous TID WC   insulin  glargine-yfgn  7 Units Subcutaneous Q24H   sodium chloride  flush  3 mL Intravenous Q12H   Continuous Infusions: PRN Meds:.acetaminophen , albuterol , melatonin, ondansetron  (ZOFRAN ) IV, polyethylene glycol  Current Outpatient Medications  Medication Instructions   amphetamine-dextroamphetamine (ADDERALL XR) 20 MG 24 hr capsule 40 mg, Oral, Daily   aspirin  81 mg, Daily   atorvastatin  (LIPITOR ) 80 mg, Daily   buPROPion (WELLBUTRIN XL) 300 mg, Daily   glimepiride (AMARYL) 2 mg, Daily before breakfast   lisinopril (ZESTRIL) 20 mg, Daily   metFORMIN (GLUCOPHAGE-XR) 1,000 mg, Daily with breakfast   tadalafil (CIALIS) 20 mg, Oral, Daily PRN    Diet Orders (From admission, onward)     Start     Ordered   01/05/24 2011  Diet Heart Room service appropriate? Yes; Fluid consistency: Thin  Diet effective now       Comments: Ok for diet if passes swallow screen  Question Answer Comment  Room service appropriate? Yes   Fluid consistency: Thin      01/05/24 2011            DVT prophylaxis: enoxaparin  (LOVENOX ) injection 40 mg Start: 01/05/24 2100   Lab Results  Component Value Date   PLT 203 01/06/2024      Code Status: Full Code  Family Communication: No family at bedside  Status is: Inpatient   Level of care: Telemetry Medical  Consultants:  Neurology  Objective: Vitals:  01/06/24 1931 01/07/24 0502 01/07/24 0817 01/07/24 1200  BP: (!) 152/99 (!) 157/104 (!) 137/94 (!) 155/91  Pulse: 91 78 80 84  Resp: 19 19 18 20   Temp: 99.1 F (37.3 C) 98.1 F (36.7 C) 97.9 F (36.6 C) 98.2 F (36.8 C)  TempSrc: Oral Oral Oral Oral  SpO2: 97% 97% 99% 100%  Weight:      Height:        Intake/Output Summary (Last 24 hours) at 01/07/2024 1612 Last data filed at 01/07/2024 0933 Gross  per 24 hour  Intake 486 ml  Output --  Net 486 ml   Wt Readings from Last 3 Encounters:  01/05/24 86.2 kg  06/05/12 88 kg    Examination:  Constitutional: NAD Eyes: lids and conjunctivae normal, no scleral icterus ENMT: mmm Neck: normal, supple Respiratory: clear to auscultation bilaterally, no wheezing, no crackles. Normal respiratory effort.  Cardiovascular: Regular rate and rhythm, no murmurs / rubs / gallops. No LE edema. Abdomen: soft, no distention, no tenderness. Bowel sounds positive.   Data Reviewed: I have independently reviewed following labs and imaging studies   CBC Recent Labs  Lab 01/05/24 1846 01/05/24 1850 01/06/24 0513  WBC 8.0  --  5.9  HGB 15.6 16.3 14.4  HCT 46.2 48.0 42.7  PLT 221  --  203  MCV 85.1  --  82.8  MCH 28.7  --  27.9  MCHC 33.8  --  33.7  RDW 13.4  --  13.4  LYMPHSABS 1.5  --   --   MONOABS 0.7  --   --   EOSABS 0.2  --   --   BASOSABS 0.1  --   --     Recent Labs  Lab 01/05/24 1846 01/05/24 1850 01/06/24 0513 01/06/24 0514  NA 139 138 138  --   K 3.8 3.8 3.5  --   CL 103 102 107  --   CO2 24  --  24  --   GLUCOSE 227* 224* 188*  --   BUN 13 16 13   --   CREATININE 0.97 0.90 0.90  --   CALCIUM  9.1  --  8.8*  --   AST 20  --   --   --   ALT 18  --   --   --   ALKPHOS 64  --   --   --   BILITOT 0.8  --   --   --   ALBUMIN 3.6  --   --   --   MG  --   --  1.8  --   INR 1.0  --   --   --   HGBA1C  --   --   --  12.1*    ------------------------------------------------------------------------------------------------------------------ Recent Labs    01/06/24 0514  CHOL 184  HDL 40*  LDLCALC 117*  TRIG 135  CHOLHDL 4.6    Lab Results  Component Value Date   HGBA1C 12.1 (H) 01/06/2024   ------------------------------------------------------------------------------------------------------------------ No results for input(s): TSH, T4TOTAL, T3FREE, THYROIDAB in the last 72 hours.  Invalid input(s):  FREET3  Cardiac Enzymes No results for input(s): CKMB, TROPONINI, MYOGLOBIN in the last 168 hours.  Invalid input(s): CK ------------------------------------------------------------------------------------------------------------------ No results found for: BNP  CBG: Recent Labs  Lab 01/06/24 1257 01/06/24 1632 01/06/24 2156 01/07/24 0559 01/07/24 1159  GLUCAP 196* 157* 190* 153* 312*    No results found for this or any previous visit (from the past 240 hours).   Radiology  Studies: VAS US  LOWER EXTREMITY VENOUS (DVT) Result Date: 01/07/2024  Lower Venous DVT Study Patient Name:  WOODLEY PETZOLD  Date of Exam:   01/07/2024 Medical Rec #: [id number]       Accession #:    [id number] Date of Birth: 12-07-1960       Patient Gender: M Patient Age:   81 years Exam Location:  [facility] Procedure:      VAS US  LOWER EXTREMITY VENOUS (DVT) Referring Phys: ARY XU --------------------------------------------------------------------------------  Indications: Embolic stroke.  Comparison Study: No priors. Performing Technologist: Ricka Sturdivant-Jones RDMS, RVT  Examination Guidelines: A complete evaluation includes B-mode imaging, spectral Doppler, color Doppler, and power Doppler as needed of all accessible portions of each vessel. Bilateral testing is considered an integral part of a complete examination. Limited examinations for reoccurring indications may be performed as noted. The reflux portion of the exam is performed with the patient in reverse Trendelenburg.  +---------+---------------+---------+-----------+----------+--------------+ RIGHT    CompressibilityPhasicitySpontaneityPropertiesThrombus Aging +---------+---------------+---------+-----------+----------+--------------+ CFV      Full           Yes      Yes                                 +---------+---------------+---------+-----------+----------+--------------+ SFJ      Full                                                         +---------+---------------+---------+-----------+----------+--------------+ FV Prox  Full                                                        +---------+---------------+---------+-----------+----------+--------------+ FV Mid   Full                                                        +---------+---------------+---------+-----------+----------+--------------+ FV DistalFull                                                        +---------+---------------+---------+-----------+----------+--------------+ PFV      Full                                                        +---------+---------------+---------+-----------+----------+--------------+ POP      Full           Yes      Yes                                 +---------+---------------+---------+-----------+----------+--------------+ PTV      Full                                                        +---------+---------------+---------+-----------+----------+--------------+  PERO     Full                                                        +---------+---------------+---------+-----------+----------+--------------+   +---------+---------------+---------+-----------+----------+--------------+ LEFT     CompressibilityPhasicitySpontaneityPropertiesThrombus Aging +---------+---------------+---------+-----------+----------+--------------+ CFV      Full           Yes      Yes                                 +---------+---------------+---------+-----------+----------+--------------+ SFJ      Full                                                        +---------+---------------+---------+-----------+----------+--------------+ FV Prox  Full                                                        +---------+---------------+---------+-----------+----------+--------------+ FV Mid   Full                                                         +---------+---------------+---------+-----------+----------+--------------+ FV DistalFull                                                        +---------+---------------+---------+-----------+----------+--------------+ PFV      Full                                                        +---------+---------------+---------+-----------+----------+--------------+ POP      Full           Yes      Yes                                 +---------+---------------+---------+-----------+----------+--------------+ PTV      Full                                                        +---------+---------------+---------+-----------+----------+--------------+ PERO     Full                                                        +---------+---------------+---------+-----------+----------+--------------+  Summary: BILATERAL: - No evidence of deep vein thrombosis seen in the lower extremities, bilaterally. -No evidence of popliteal cyst, bilaterally.   *See table(s) above for measurements and observations.    Preliminary      Nilda Fendt, MD, PhD [facility]  Between 7 am - 7 pm I am available, please contact me via Amion (for emergencies) or Securechat (non urgent messages)  Between 7 pm - 7 am I am not available, please contact night coverage MD/APP via Amion

## 2024-01-07 NOTE — Progress Notes (Signed)
 Pt has self-administered all insulin  injections with this RN present throughout this shift without complications. Pt has no questions at this time. Pt verbalizes understanding regarding insulin  injections.

## 2024-01-08 ENCOUNTER — Other Ambulatory Visit (HOSPITAL_COMMUNITY): Payer: Self-pay

## 2024-01-08 ENCOUNTER — Encounter (HOSPITAL_COMMUNITY)
Admission: EM | Disposition: A | Discharge status: Home / Self Care | Payer: Self-pay | Source: Home / Self Care | Attending: Internal Medicine

## 2024-01-08 DIAGNOSIS — R2990 Unspecified symptoms and signs involving the nervous system: Secondary | ICD-10-CM | POA: Diagnosis not present

## 2024-01-08 DIAGNOSIS — E785 Hyperlipidemia, unspecified: Secondary | ICD-10-CM | POA: Diagnosis not present

## 2024-01-08 DIAGNOSIS — I6350 Cerebral infarction due to unspecified occlusion or stenosis of unspecified cerebral artery: Secondary | ICD-10-CM | POA: Diagnosis not present

## 2024-01-08 DIAGNOSIS — I639 Cerebral infarction, unspecified: Secondary | ICD-10-CM | POA: Diagnosis not present

## 2024-01-08 DIAGNOSIS — Z7982 Long term (current) use of aspirin: Secondary | ICD-10-CM

## 2024-01-08 DIAGNOSIS — E1165 Type 2 diabetes mellitus with hyperglycemia: Secondary | ICD-10-CM | POA: Diagnosis not present

## 2024-01-08 DIAGNOSIS — Z794 Long term (current) use of insulin: Secondary | ICD-10-CM | POA: Diagnosis not present

## 2024-01-08 HISTORY — PX: LOOP RECORDER INSERTION: EP1214

## 2024-01-08 LAB — GLUCOSE, CAPILLARY
Glucose-Capillary: 165 mg/dL — ABNORMAL HIGH (ref 70–99)
Glucose-Capillary: 267 mg/dL — ABNORMAL HIGH (ref 70–99)

## 2024-01-08 SURGERY — LOOP RECORDER INSERTION

## 2024-01-08 MED ORDER — LIDOCAINE-EPINEPHRINE 1 %-1:100000 IJ SOLN
INTRAMUSCULAR | Status: DC | PRN
  Administered 2024-01-08: 20 mL

## 2024-01-08 MED ORDER — INSULIN PEN NEEDLE 32G X 4 MM MISC
1.0000 | Freq: Every day | 0 refills | Status: AC
Start: 2024-01-08 — End: ?
  Filled 2024-01-08: qty 100, 100d supply, fill #0

## 2024-01-08 MED ORDER — LIDOCAINE-EPINEPHRINE 1 %-1:100000 IJ SOLN
INTRAMUSCULAR | Status: AC
  Filled 2024-01-08: qty 1

## 2024-01-08 MED ORDER — FREESTYLE LIBRE 3 SENSOR MISC
1.0000 | 0 refills | Status: AC
  Filled 2024-01-08: qty 2, 28d supply, fill #0

## 2024-01-08 MED ORDER — ASPIRIN 81 MG PO TBEC
81.0000 mg | DELAYED_RELEASE_TABLET | Freq: Every day | ORAL | 0 refills | Status: AC
  Filled 2024-01-08: qty 21, 21d supply, fill #0

## 2024-01-08 MED ORDER — LANTUS SOLOSTAR 100 UNIT/ML ~~LOC~~ SOPN
8.0000 [IU] | PEN_INJECTOR | Freq: Every day | SUBCUTANEOUS | 0 refills | Status: AC
  Filled 2024-01-08: qty 3, 28d supply, fill #0

## 2024-01-08 MED ORDER — CLOPIDOGREL BISULFATE 75 MG PO TABS
75.0000 mg | ORAL_TABLET | Freq: Every day | ORAL | 0 refills | Status: AC
  Filled 2024-01-08: qty 90, 90d supply, fill #0

## 2024-01-08 SURGICAL SUPPLY — 2 items
MONITOR REVEAL LINQ II (Prosthesis & Implant Heart) IMPLANT
PACK LOOP INSERTION (CUSTOM PROCEDURE TRAY) ×1 IMPLANT

## 2024-01-08 NOTE — Inpatient Diabetes Management (Signed)
 Inpatient Diabetes Program Recommendations  AACE/ADA: New Consensus Statement on Inpatient Glycemic Control (2015)  Target Ranges:  Prepandial:   less than 140 mg/dL      Peak postprandial:   less than 180 mg/dL (1-2 hours)      Critically ill patients:  140 - 180 mg/dL   Lab Results  Component Value Date   GLUCAP 267 (H) 01/08/2024   HGBA1C 12.1 (H) 01/06/2024   Spoke with patient and two daughters @ bedside. Educated patient on insulin  pen use at home. Reviewed contents of insulin  flexpen starter kit. Reviewed all steps if insulin  pen including attachment of needle, 2-unit air shot, dialing up dose, giving injection, removing needle, disposal of sharps, storage of unused insulin , disposal of insulin  etc. Patient able to provide successful return demonstration. Patient has been on Ozempic pen in the past and states understanding. Also reviewed troubleshooting with insulin  pen. MD to give patient Rxs for insulin  pens and insulin  pen needles.   MD ordered application of Freestyle CGM at discharge for patient. Education done regarding application and changing CGM sensor (alternate every 15 days on back of arms), 1 hour warm-up, use of glucometer when alert displays, how to scan CGM for glucose reading and information for PCP. Patient has also been given educational packet regarding use CGM sensor including the 1-800 toll free number for any questions, problems or needs related to the [facility] sensors or reader.    Sensor applied by patient to (L) Arm at 3:15 pm .  Explained that glucose readings will not be available until 1 hour after application. Reviewed use of CGM including changing Sensor, Vitamin C warning, arrows with glucose readings, and Freestyle app. Patient very appreciative.   Thank you, Nila Winker E. Artha Stavros, RN, MSN, CDCES  Diabetes Coordinator Inpatient Glycemic Control Team Team Pager [phone] (8am-5pm) 01/08/2024 3:31 PM

## 2024-01-08 NOTE — Plan of Care (Signed)
  Problem: Education: Goal: Knowledge of disease or condition will improve Outcome: Not Progressing Goal: Knowledge of secondary prevention will improve (MUST DOCUMENT ALL) Outcome: Not Progressing Goal: Knowledge of patient specific risk factors will improve (DELETE if not current risk factor) Outcome: Not Progressing   Problem: Ischemic Stroke/TIA Tissue Perfusion: Goal: Complications of ischemic stroke/TIA will be minimized Outcome: Not Progressing   Problem: Coping: Goal: Will verbalize positive feelings about self Outcome: Not Progressing Goal: Will identify appropriate support needs Outcome: Not Progressing   Problem: Health Behavior/Discharge Planning: Goal: Ability to manage health-related needs will improve Outcome: Not Progressing Goal: Goals will be collaboratively established with patient/family Outcome: Not Progressing   Problem: Self-Care: Goal: Ability to participate in self-care as condition permits will improve Outcome: Not Progressing Goal: Verbalization of feelings and concerns over difficulty with self-care will improve Outcome: Not Progressing Goal: Ability to communicate needs accurately will improve Outcome: Not Progressing   Problem: Nutrition: Goal: Risk of aspiration will decrease Outcome: Not Progressing Goal: Dietary intake will improve Outcome: Not Progressing   Problem: Education: Goal: Ability to describe self-care measures that may prevent or decrease complications (Diabetes Survival Skills Education) will improve Outcome: Not Progressing Goal: Individualized Educational Video(s) Outcome: Not Progressing   Problem: Coping: Goal: Ability to adjust to condition or change in health will improve Outcome: Not Progressing   Problem: Fluid Volume: Goal: Ability to maintain a balanced intake and output will improve Outcome: Not Progressing   Problem: Health Behavior/Discharge Planning: Goal: Ability to identify and utilize available  resources and services will improve Outcome: Not Progressing Goal: Ability to manage health-related needs will improve Outcome: Not Progressing   Problem: Metabolic: Goal: Ability to maintain appropriate glucose levels will improve Outcome: Not Progressing   Problem: Nutritional: Goal: Maintenance of adequate nutrition will improve Outcome: Not Progressing Goal: Progress toward achieving an optimal weight will improve Outcome: Not Progressing   Problem: Skin Integrity: Goal: Risk for impaired skin integrity will decrease Outcome: Not Progressing   Problem: Tissue Perfusion: Goal: Adequacy of tissue perfusion will improve Outcome: Not Progressing   Problem: Education: Goal: Knowledge of General Education information will improve Description: Including pain rating scale, medication(s)/side effects and non-pharmacologic comfort measures Outcome: Not Progressing   Problem: Health Behavior/Discharge Planning: Goal: Ability to manage health-related needs will improve Outcome: Not Progressing   Problem: Clinical Measurements: Goal: Ability to maintain clinical measurements within normal limits will improve Outcome: Not Progressing Goal: Will remain free from infection Outcome: Not Progressing Goal: Diagnostic test results will improve Outcome: Not Progressing Goal: Respiratory complications will improve Outcome: Not Progressing Goal: Cardiovascular complication will be avoided Outcome: Not Progressing   Problem: Activity: Goal: Risk for activity intolerance will decrease Outcome: Not Progressing   Problem: Nutrition: Goal: Adequate nutrition will be maintained Outcome: Not Progressing   Problem: Coping: Goal: Level of anxiety will decrease Outcome: Not Progressing   Problem: Elimination: Goal: Will not experience complications related to bowel motility Outcome: Not Progressing Goal: Will not experience complications related to urinary retention Outcome: Not  Progressing   Problem: Pain Managment: Goal: General experience of comfort will improve and/or be controlled Outcome: Not Progressing   Problem: Safety: Goal: Ability to remain free from injury will improve Outcome: Not Progressing   Problem: Skin Integrity: Goal: Risk for impaired skin integrity will decrease Outcome: Not Progressing

## 2024-01-08 NOTE — TOC Transition Note (Signed)
 Transition of Care [facility]) - Discharge Note   Patient Details  Name: Charles Burton MRN: [id number] Date of Birth: [birth year]  [facility] CM/SW Contact:  Andrez JULIANNA George, RN Phone Number: 01/08/2024, 2:27 PM   Clinical Narrative:     Pt is discharging home to [place], [place]. He recently has moved to [place]: [address] [place], [place] [address] Outpatient OT arranged with [facility]. Orders faxed: [phone] Pt without a PCP in [place]. CM has placed information for Dr Thedora on AVS. They will get him a hospital follow up appointment after he comes by office to fill out paperwork.  Pt has supervision at home per his 2 daughters. They will provide needed transportation and oversee his medications.    Final next level of care: OP Rehab Barriers to Discharge: No Barriers Identified   Patient Goals and CMS Choice     Choice offered to / list presented to : Patient      Discharge Placement                       Discharge Plan and Services Additional resources added to the After Visit Summary for                                       Social Drivers of Health (SDOH) Interventions SDOH Screenings   Food Insecurity: No Food Insecurity (01/06/2024)  Housing: Low Risk  (01/06/2024)  Transportation Needs: No Transportation Needs (01/06/2024)  Utilities: Not At Risk (01/06/2024)  Social Connections: Moderately Integrated (01/06/2024)  Tobacco Use: High Risk (01/05/2024)     Readmission Risk Interventions     No data to display

## 2024-01-08 NOTE — Evaluation (Signed)
 Speech Language Pathology Evaluation Patient Details Name: DONTE LENZO MRN: [id number] DOB: Jul 10, 1960 Today's Date: 01/08/2024 Time: 1048-1100 SLP Time Calculation (min) (ACUTE ONLY): 12 min  Problem List:  Patient Active Problem List   Diagnosis Date Noted   Stroke-like symptom 01/05/2024   Umbilical hernia 06/05/2012   Past Medical History:  Past Medical History:  Diagnosis Date   Anxiety    Diabetes mellitus without complication (HCC)    GERD (gastroesophageal reflux disease)    Hyperlipidemia    Hypertension    Kidney stones    Low testosterone    Past Surgical History:  Past Surgical History:  Procedure Laterality Date   KIDNEY STONE SURGERY  2000   KNEE CARTILAGE SURGERY  1980   NOSE SURGERY  1978   THYROIDECTOMY     HPI:  Pt is a 63 yo male presenting to [facility] on 01/05/24 as a code stroke following episode of slurred speech. 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 PMH of GERD, DM, HLD, HTN, anxiety.   Assessment / Plan / Recommendation Clinical Impression  Pt reports noticing some short-term memory deficits over the past year but states all acute cognitive-linguistic deficits have resolved since being admitted. He scored WFL on all subtests of the [employer]ogn[employer] except for delayed recall, with which he was 75% accurate independently and 100% accurate with Min semantic cueing. During the more structured expressive language tasks, there were occasional phonemic paraphasias noted that were not previously observed during spontaneous conversation. Discussed recommendation from OT to receive full supervision with medications, finances, driving, and cooking with pt and his daughters. Recommend ongoing intervention for cognitive-linguistic deficits on an OP basis. SLP will follow acutely.    SLP Assessment  SLP Recommendation/Assessment: Patient needs continued Speech Language Pathology Services SLP Visit Diagnosis: Cognitive communication deficit  (R41.841)     Assistance Recommended at Discharge  Frequent or constant Supervision/Assistance  Functional Status Assessment Patient has had a recent decline in their functional status and demonstrates the ability to make significant improvements in function in a reasonable and predictable amount of time.  Frequency and Duration min 2x/week  2 weeks      SLP Evaluation Cognition  Overall Cognitive Status: Impaired/Different from baseline Arousal/Alertness: Awake/alert Orientation Level: Oriented X4 Attention: Sustained Sustained Attention: Appears intact Memory: Impaired Memory Impairment: Retrieval deficit Awareness: Appears intact Problem Solving: Appears intact Executive Function: Reasoning Reasoning: Appears intact       Comprehension  Auditory Comprehension Overall Auditory Comprehension: Appears within functional limits for tasks assessed    Expression Expression Primary Mode of Expression: Verbal Verbal Expression Overall Verbal Expression: Appears within functional limits for tasks assessed   Oral / Motor  Oral Motor/Sensory Function Overall Oral Motor/Sensory Function: Within functional limits Motor Speech Overall Motor Speech: Appears within functional limits for tasks assessed            Damien Blumenthal, M.A., CCC-SLP Speech Language Pathology, Acute Rehabilitation Services  Secure Chat preferred [phone]  01/08/2024, 11:39 AM

## 2024-01-08 NOTE — Discharge Summary (Signed)
 Physician Discharge Summary  Charles Burton FMW:[id number] DOB: [birth year] DOA: 01/05/2024  PCP: System, Provider Not In  Admit date: 01/05/2024 Discharge date: 01/08/2024  Admitted From: home Disposition:  home  Recommendations for Outpatient Follow-up:  Follow up with PCP in 1-2 weeks  Home Health: none Equipment/Devices: none  Discharge Condition: stable CODE STATUS: Full code Diet Orders (From admission, onward)     Start     Ordered   01/05/24 2011  Diet Heart Room service appropriate? Yes; Fluid consistency: Thin  Diet effective now       Comments: Ok for diet if passes swallow screen  Question Answer Comment  Room service appropriate? Yes   Fluid consistency: Thin      01/05/24 2011            HPI:  Charles Burton is a 63 y.o. male with hx of diabetes type 2, hypertension, hyperlipidemia, OSA, neuropathy, who was referred to the ED by urgent care for strokelike symptoms. LKWT 12 PM with acute onset of dysarthria, and expressive aphasia, took a nap after onset and slept about 40 minutes. Overall symptoms lasted ~ 1 hr. Still feels that his speech is not currently 100% back to baseline at time of interview. Also had some transient numbness into the R hand. No other focal numbness or weakness, or vision changes. Also had headache. Seen at urgent care who referred to ED and symptoms had resolved prior to ED evaluation. Denies any other recent illness. Taking aspirin  81 mg daily prior to this admission.   Hospital Course / Discharge diagnoses: Principal problem Acute CVA -patient presented to the hospital with concern for stroke.  Neurology consulted and followed patient while hospitalized.  An MRI of the brain showed 7 mm acute ischemic nonhemorrhagic subcortical posterior right frontal lobe infarct, but also underlying moderate chronic microvascular ischemic disease.  CT angiogram was negative for large vessel occlusion but it did show atherosclerosis.  Lipid panel showed an  LDL of 117.  A1c was elevated 12.1.  A 2D echocardiogram was fairly unremarkable with LVEF 60 to 65%, mild concentric LVH, grade 1 diastolic dysfunction, RV was normal.  As neurology felt that same symptoms are consistent with location of the initial stroke, he underwent a repeat MRI which showed an additional punctate CVA.  On the right lower extremity Doppler was negative for DVT.  Neurology recommends statin, aspirin  and Plavix  for 21 days followed by Plavix  alone.  He underwent loop recorder placement prior to discharge as well.  Active problems Diabetes mellitus, poorly controlled, with hyperglycemia -his A1c was found to be elevated at 12.1.  At home he is on glimepiride metformin and Ozempic, however he admits that he is not taking the medications as prescribed and is liberal with his diet.  Long discussion with the patient at bedside, given A1c above 10 I do recommend insulin , he is agreeable and will start once daily long-acting.  Recommend outpatient follow-up Hyperlipidemia-continue statin Essential hypertension -resume home medications  OSA-does not use CPAP  Sepsis ruled out   Discharge Instructions   Allergies as of 01/08/2024   No Known Allergies      Medication List     STOP taking these medications    aspirin  81 MG tablet Replaced by: aspirin  EC 81 MG tablet       TAKE these medications    amphetamine-dextroamphetamine 20 MG 24 hr capsule Commonly known as: ADDERALL XR Take 40 mg by mouth daily.   aspirin  EC 81  MG tablet Take 1 tablet (81 mg total) by mouth daily for 21 days. Swallow whole. Start taking on: January 09, 2024 Replaces: aspirin  81 MG tablet   atorvastatin  80 MG tablet Commonly known as: LIPITOR  Take 80 mg by mouth daily.   buPROPion 300 MG 24 hr tablet Commonly known as: WELLBUTRIN XL Take 300 mg by mouth daily.   clopidogrel  75 MG tablet Commonly known as: PLAVIX  Take 1 tablet (75 mg total) by mouth daily. Start taking on: January 09, 2024    FreeStyle Libre 3 Sensor Misc 1 each by Does not apply route every 14 (fourteen) days. Place 1 sensor on the skin every 14 days. Use to check glucose continuously   glimepiride 2 MG tablet Commonly known as: AMARYL Take 2 mg by mouth daily before breakfast.   Lantus  SoloStar 100 UNIT/ML Solostar Pen Generic drug: insulin  glargine Inject 8 Units into the skin daily.   lisinopril 20 MG tablet Commonly known as: ZESTRIL Take 20 mg by mouth daily.   metFORMIN 500 MG 24 hr tablet Commonly known as: GLUCOPHAGE-XR Take 1,000 mg by mouth daily with breakfast.   Pen Needles 31G X 5 MM Misc 1 each by Does not apply route daily.   tadalafil 20 MG tablet Commonly known as: CIALIS Take 20 mg by mouth daily as needed for erectile dysfunction.       Consultations: Cardiology Neurology   Procedures/Studies:  VAS US  LOWER EXTREMITY VENOUS (DVT) Result Date: 01/07/2024  Lower Venous DVT Study Patient Name:  Charles Burton  Date of Exam:   01/07/2024 Medical Rec #: [id number]       Accession #:    [id number] Date of Birth: 16-Dec-1960       Patient Gender: M Patient Age:   73 years Exam Location:  [facility] Procedure:      VAS US  LOWER EXTREMITY VENOUS (DVT) Referring Phys: ARY XU --------------------------------------------------------------------------------  Indications: Embolic stroke.  Comparison Study: No priors. Performing Technologist: Ricka Sturdivant-Jones RDMS, RVT  Examination Guidelines: A complete evaluation includes B-mode imaging, spectral Doppler, color Doppler, and power Doppler as needed of all accessible portions of each vessel. Bilateral testing is considered an integral part of a complete examination. Limited examinations for reoccurring indications may be performed as noted. The reflux portion of the exam is performed with the patient in reverse Trendelenburg.  +---------+---------------+---------+-----------+----------+--------------+ RIGHT     CompressibilityPhasicitySpontaneityPropertiesThrombus Aging +---------+---------------+---------+-----------+----------+--------------+ CFV      Full           Yes      Yes                                 +---------+---------------+---------+-----------+----------+--------------+ SFJ      Full                                                        +---------+---------------+---------+-----------+----------+--------------+ FV Prox  Full                                                        +---------+---------------+---------+-----------+----------+--------------+ FV Mid   Full                                                        +---------+---------------+---------+-----------+----------+--------------+  FV DistalFull                                                        +---------+---------------+---------+-----------+----------+--------------+ PFV      Full                                                        +---------+---------------+---------+-----------+----------+--------------+ POP      Full           Yes      Yes                                 +---------+---------------+---------+-----------+----------+--------------+ PTV      Full                                                        +---------+---------------+---------+-----------+----------+--------------+ PERO     Full                                                        +---------+---------------+---------+-----------+----------+--------------+   +---------+---------------+---------+-----------+----------+--------------+ LEFT     CompressibilityPhasicitySpontaneityPropertiesThrombus Aging +---------+---------------+---------+-----------+----------+--------------+ CFV      Full           Yes      Yes                                 +---------+---------------+---------+-----------+----------+--------------+ SFJ      Full                                                         +---------+---------------+---------+-----------+----------+--------------+ FV Prox  Full                                                        +---------+---------------+---------+-----------+----------+--------------+ FV Mid   Full                                                        +---------+---------------+---------+-----------+----------+--------------+ FV DistalFull                                                        +---------+---------------+---------+-----------+----------+--------------+  PFV      Full                                                        +---------+---------------+---------+-----------+----------+--------------+ POP      Full           Yes      Yes                                 +---------+---------------+---------+-----------+----------+--------------+ PTV      Full                                                        +---------+---------------+---------+-----------+----------+--------------+ PERO     Full                                                        +---------+---------------+---------+-----------+----------+--------------+     Summary: BILATERAL: - No evidence of deep vein thrombosis seen in the lower extremities, bilaterally. -No evidence of popliteal cyst, bilaterally.   *See table(s) above for measurements and observations.    Preliminary    MR BRAIN WO CONTRAST Result Date: 01/06/2024 CLINICAL DATA:  Follow-up examination for stroke. EXAM: MRI HEAD WITHOUT CONTRAST TECHNIQUE: Multiplanar, multiecho pulse sequences of the brain and surrounding structures were obtained without intravenous contrast. COMPARISON:  Prior studies from 01/05/2024 FINDINGS: Brain: Limited study with diffusion-weighted imaging only was performed. Previously identified 7 mm acute ischemic nonhemorrhagic infarct involving the subcortical posterior right frontal lobe again seen, stable. There is a new punctate acute  ischemic infarct involving the contralateral left frontal cortex (series 2, image 38). No other evidence for new or interval infarction elsewhere. No other new abnormality seen on this limited exam. Vascular: Not well assessed on this limited exam. Skull and upper cervical spine: Not well assessed on this limited exam. Sinuses/Orbits: Not well assessed on this limited exam. Other: None. IMPRESSION: 1. New punctate acute ischemic nonhemorrhagic infarct involving the left frontal cortex. 2. Stable 7 mm acute ischemic nonhemorrhagic subcortical posterior right frontal lobe infarct. Electronically Signed   By: Morene Hoard M.D.   On: 01/06/2024 17:45   ECHOCARDIOGRAM COMPLETE Result Date: 01/06/2024    ECHOCARDIOGRAM REPORT   Patient Name:   Charles Burton Date of Exam: 01/06/2024 Medical Rec #:  [id number]      Height:       70.0 in Accession #:    [id number]     Weight:       190.0 lb Date of Birth:  [birth year]      BSA:          2.042 m Patient Age:    63 years       BP:           143/95 mmHg Patient Gender: M              HR:  80 bpm. Exam Location:  Inpatient Procedure: 2D Echo, Color Doppler, Cardiac Doppler and Saline Contrast Bubble            Study (Both Spectral and Color Flow Doppler were utilized during            procedure). Indications:    TIA  History:        Patient has no prior history of Echocardiogram examinations.                 Risk Factors:Hypertension, Diabetes, Dyslipidemia and Sleep                 Apnea.  Sonographer:    Damien Senior RDCS Referring Phys: [id number] DORN DAWSON IMPRESSIONS  1. Left ventricular ejection fraction, by estimation, is 60 to 65%. The left ventricle has normal function. The left ventricle has no regional wall motion abnormalities. There is mild concentric left ventricular hypertrophy. Left ventricular diastolic parameters are consistent with Grade I diastolic dysfunction (impaired relaxation).  2. Right ventricular systolic function is normal. The  right ventricular size is normal. Tricuspid regurgitation signal is inadequate for assessing PA pressure.  3. Agitated saline contrast bubble study was negative, with no evidence of any interatrial shunt.  4. The mitral valve is grossly normal. Trivial mitral valve regurgitation.  5. The aortic valve is tricuspid. There is mild calcification of the aortic valve. Aortic valve regurgitation is not visualized. Aortic valve sclerosis is present, with no evidence of aortic valve stenosis.  6. The inferior vena cava is normal in size with greater than 50% respiratory variability, suggesting right atrial pressure of 3 mmHg. Comparison(s): No prior Echocardiogram. FINDINGS  Left Ventricle: Left ventricular ejection fraction, by estimation, is 60 to 65%. The left ventricle has normal function. The left ventricle has no regional wall motion abnormalities. The left ventricular internal cavity size was normal in size. There is  mild concentric left ventricular hypertrophy. Left ventricular diastolic parameters are consistent with Grade I diastolic dysfunction (impaired relaxation). Right Ventricle: The right ventricular size is normal. No increase in right ventricular wall thickness. Right ventricular systolic function is normal. Tricuspid regurgitation signal is inadequate for assessing PA pressure. Left Atrium: Left atrial size was normal in size. Right Atrium: Right atrial size was normal in size. Pericardium: There is no evidence of pericardial effusion. Mitral Valve: The mitral valve is grossly normal. Trivial mitral valve regurgitation. Tricuspid Valve: The tricuspid valve is grossly normal. Tricuspid valve regurgitation is trivial. Aortic Valve: The aortic valve is tricuspid. There is mild calcification of the aortic valve. There is mild aortic valve annular calcification. Aortic valve regurgitation is not visualized. Aortic valve sclerosis is present, with no evidence of aortic valve stenosis. Pulmonic Valve: The  pulmonic valve was grossly normal. Pulmonic valve regurgitation is trivial. Aorta: The aortic root and ascending aorta are structurally normal, with no evidence of dilitation. Venous: The inferior vena cava is normal in size with greater than 50% respiratory variability, suggesting right atrial pressure of 3 mmHg. IAS/Shunts: No atrial level shunt detected by color flow Doppler. Agitated saline contrast was given intravenously to evaluate for intracardiac shunting. Agitated saline contrast bubble study was negative, with no evidence of any interatrial shunt. Additional Comments: 3D was performed not requiring image post processing on an independent workstation and was indeterminate.  LEFT VENTRICLE PLAX 2D LVIDd:         3.50 cm   Diastology LVIDs:         2.40 cm  LV e' medial:    9.25 cm/s LV PW:         1.30 cm   LV E/e' medial:  6.0 LV IVS:        1.30 cm   LV e' lateral:   7.83 cm/s LVOT diam:     2.10 cm   LV E/e' lateral: 7.1 LV SV:         56 LV SV Index:   28 LVOT Area:     3.46 cm  RIGHT VENTRICLE RV S prime:     13.10 cm/s TAPSE (M-mode): 2.1 cm LEFT ATRIUM             Index        RIGHT ATRIUM           Index LA diam:        3.60 cm 1.76 cm/m   RA Area:     12.10 cm LA Vol (A2C):   31.9 ml 15.62 ml/m  RA Volume:   25.90 ml  12.68 ml/m LA Vol (A4C):   33.0 ml 16.16 ml/m LA Biplane Vol: 32.3 ml 15.82 ml/m  AORTIC VALVE LVOT Vmax:   90.20 cm/s LVOT Vmean:  66.600 cm/s LVOT VTI:    0.163 m  AORTA Ao Root diam: 3.20 cm Ao Asc diam:  3.10 cm MITRAL VALVE MV Area (PHT): 2.42 cm    SHUNTS MV Decel Time: 313 msec    Systemic VTI:  0.16 m MV E velocity: 55.50 cm/s  Systemic Diam: 2.10 cm MV A velocity: 66.00 cm/s MV E/A ratio:  0.84 Jayson Sierras MD Electronically signed by Jayson Sierras MD Signature Date/Time: 01/06/2024/4:57:59 PM    Final    DG CHEST PORT 1 VIEW Result Date: 01/05/2024 CLINICAL DATA:  Cough.  Aphasia. EXAM: PORTABLE CHEST 1 VIEW COMPARISON:  10/17/2017 FINDINGS: Shallow  inspiration. Heart size and pulmonary vascularity are normal. Lungs are clear. No pleural effusion or pneumothorax. Mediastinal contours appear intact. Calcification of the aorta. Degenerative changes in the spine. IMPRESSION: No active disease. Electronically Signed   By: Elsie Gravely M.D.   On: 01/05/2024 22:07   CT ANGIO HEAD NECK W WO CM Result Date: 01/05/2024 CLINICAL DATA:  Initial evaluation for acute stroke. EXAM: CT ANGIOGRAPHY HEAD AND NECK WITH AND WITHOUT CONTRAST TECHNIQUE: Multidetector CT imaging of the head and neck was performed using the standard protocol during bolus administration of intravenous contrast. Multiplanar CT image reconstructions and MIPs were obtained to evaluate the vascular anatomy. Carotid stenosis measurements (when applicable) are obtained utilizing NASCET criteria, using the distal internal carotid diameter as the denominator. RADIATION DOSE REDUCTION: This exam was performed according to the departmental dose-optimization program which includes automated exposure control, adjustment of the mA and/or kV according to patient size and/or use of iterative reconstruction technique. CONTRAST:  75mL OMNIPAQUE  IOHEXOL  350 MG/ML SOLN COMPARISON: Prior CT and MRI from earlier the same day. FINDINGS: CTA NECK FINDINGS Aortic arch: Visualized aortic arch within normal limits for caliber with standard branch pattern. Aortic atherosclerosis. No significant stenosis about the origin the great vessels. Right carotid system: Right common and internal carotid arteries are patent without dissection. Minimal atheromatous change about the right carotid bulb without stenosis. Left carotid system: Left common and internal carotid arteries are patent without dissection. Mild atheromatous change about the left carotid bulb without stenosis. Vertebral arteries: Left vertebral artery arises directly from the aortic arch. Right vertebral artery dominant. Vertebral arteries are patent without  stenosis or dissection. Skeleton:  No discrete or worrisome osseous lesions. Other neck: No other acute finding. Upper chest: No other acute finding. Review of the MIP images confirms the above findings CTA HEAD FINDINGS Anterior circulation: Atheromatous change about the carotid siphons without hemodynamically significant stenosis. A1 segments patent bilaterally. Normal anterior communicating artery complex. Anterior cerebral arteries patent without significant stenosis. No M1 stenosis or occlusion. Distal MCA branches perfused and symmetric. Posterior circulation: Both V4 segments patent without significant stenosis. Both PICA patent. Basilar patent without stenosis. Superior cerebellar and posterior cerebral arteries patent bilaterally. Venous sinuses: Grossly patent allowing for timing the contrast bolus. Anatomic variants: As above.  No aneurysm. Review of the MIP images confirms the above findings IMPRESSION: 1. Negative CTA for large vessel occlusion or other emergent finding. 2. Mild atheromatous change about the carotid bifurcations and carotid siphons without hemodynamically significant stenosis. 3.  Aortic Atherosclerosis (ICD10-I70.0). Electronically Signed   By: Morene Hoard M.D.   On: 01/05/2024 21:42   MR BRAIN WO CONTRAST Result Date: 01/05/2024 CLINICAL DATA:  Initial evaluation for acute TIA. EXAM: MRI HEAD WITHOUT CONTRAST TECHNIQUE: Multiplanar, multiecho pulse sequences of the brain and surrounding structures were obtained without intravenous contrast. COMPARISON:  Prior CT from earlier the same day. FINDINGS: Brain: Cerebral volume within normal limits. Patchy T2/FLAIR hyperintensity involving the supratentorial cerebral white matter, consistent with chronic small vessel ischemic disease, moderate in nature. 7 mm focus of diffusion signal abnormality seen involving the subcortical posterior right frontal lobe (series 5, image 87), consistent with a small acute ischemic infarct. No  associated hemorrhage or mass effect. No other evidence for acute or subacute ischemia. No acute or chronic intracranial blood products. No mass lesion, midline shift or mass effect. No hydrocephalus or extra-axial fluid collection. Pituitary gland within normal limits. Vascular: Major intracranial vascular flow voids are maintained. Skull and upper cervical spine: Craniocervical junction within normal limits. Bone marrow signal intensity normal. No scalp soft tissue abnormality. Sinuses/Orbits: Globes orbital soft tissues within normal limits. Mild-to-moderate mucosal thickening about the ethmoidal air cells and right maxillary sinus. 12 mm T2 hyperintense lesion within the right nasal cavity, nonspecific, but could potentially reflect a polyp (series 10, image 4). No mastoid effusion. Other: None. IMPRESSION: 1. 7 mm acute ischemic nonhemorrhagic subcortical posterior right frontal lobe infarct. 2. Underlying moderate chronic microvascular ischemic disease. 3. 12 mm T2 hyperintense lesion within the right nasal cavity, nonspecific, but could potentially reflect a polyp. Correlation with physical exam suggested. Electronically Signed   By: Morene Hoard M.D.   On: 01/05/2024 21:18   CT HEAD CODE STROKE WO CONTRAST Result Date: 01/05/2024 CLINICAL DATA:  Code stroke.  Neuro deficit, concern for stroke. EXAM: CT HEAD WITHOUT CONTRAST TECHNIQUE: Contiguous axial images were obtained from the base of the skull through the vertex without intravenous contrast. RADIATION DOSE REDUCTION: This exam was performed according to the departmental dose-optimization program which includes automated exposure control, adjustment of the mA and/or kV according to patient size and/or use of iterative reconstruction technique. COMPARISON:  None Available. FINDINGS: Brain: No acute intracranial hemorrhage. No CT evidence of acute infarct. Nonspecific hypoattenuation in the periventricular and subcortical white matter favored to  reflect chronic microvascular ischemic changes. No edema, mass effect, or midline shift. The basilar cisterns are patent. Ventricles: The ventricles are normal. Vascular: Atherosclerotic calcifications of the carotid siphons. No hyperdense vessel. Skull: No acute or aggressive finding. Orbits: Orbits are symmetric. Sinuses: Mucosal thickening in the right maxillary sinus. Additional mild mucosal thickening in the ethmoid  sinuses, right greater than left. Other: Mastoid air cells are clear. ASPECTS [facility] Stroke Program Early CT Score) - Ganglionic level infarction (caudate, lentiform nuclei, internal capsule, insula, M1-M3 cortex): 7 - Supraganglionic infarction (M4-M6 cortex): 3 Total score (0-10 with 10 being normal): 10 IMPRESSION: 1. No CT evidence of acute intracranial abnormality. 2. Chronic microvascular ischemic changes. 3. ASPECTS is 10 These results were communicated to Dr. Vanessa at 7:05 pm on 01/05/2024 by text page via the [facility] messaging system. Electronically Signed   By: Donnice Mania M.D.   On: 01/05/2024 19:06     Subjective: - no chest pain, shortness of breath, no abdominal pain, nausea or vomiting.   Discharge Exam: BP (!) 165/99   Pulse 77   Temp 97.8 F (36.6 C) (Oral)   Resp 18   Ht 5' 10 (1.778 m)   Wt 86.2 kg   SpO2 99%   BMI 27.26 kg/m   General: Pt is alert, awake, not in acute distress Cardiovascular: RRR, S1/S2 +, no rubs, no gallops Respiratory: CTA bilaterally, no wheezing, no rhonchi Abdominal: Soft, NT, ND, bowel sounds + Extremities: no edema, no cyanosis    The results of significant diagnostics from this hospitalization (including imaging, microbiology, ancillary and laboratory) are listed below for reference.     Microbiology: No results found for this or any previous visit (from the past 240 hours).   Labs: Basic Metabolic Panel: Recent Labs  Lab 01/05/24 1846 01/05/24 1850 01/06/24 0513  NA 139 138 138  K 3.8 3.8 3.5  CL 103 102  107  CO2 24  --  24  GLUCOSE 227* 224* 188*  BUN 13 16 13   CREATININE 0.97 0.90 0.90  CALCIUM  9.1  --  8.8*  MG  --   --  1.8  PHOS  --   --  2.6   Liver Function Tests: Recent Labs  Lab 01/05/24 1846  AST 20  ALT 18  ALKPHOS 64  BILITOT 0.8  PROT 7.0  ALBUMIN 3.6   CBC: Recent Labs  Lab 01/05/24 1846 01/05/24 1850 01/06/24 0513  WBC 8.0  --  5.9  NEUTROABS 5.4  --   --   HGB 15.6 16.3 14.4  HCT 46.2 48.0 42.7  MCV 85.1  --  82.8  PLT 221  --  203   CBG: Recent Labs  Lab 01/07/24 0559 01/07/24 1159 01/07/24 1647 01/07/24 2140 01/08/24 0609  GLUCAP 153* 312* 206* 251* 165*   Hgb A1c Recent Labs    01/06/24 0514  HGBA1C 12.1*   Lipid Profile Recent Labs    01/06/24 0514  CHOL 184  HDL 40*  LDLCALC 117*  TRIG 135  CHOLHDL 4.6   Thyroid function studies No results for input(s): TSH, T4TOTAL, T3FREE, THYROIDAB in the last 72 hours.  Invalid input(s): FREET3 Urinalysis No results found for: COLORURINE, APPEARANCEUR, LABSPEC, PHURINE, GLUCOSEU, HGBUR, BILIRUBINUR, KETONESUR, PROTEINUR, UROBILINOGEN, NITRITE, LEUKOCYTESUR  FURTHER DISCHARGE INSTRUCTIONS:   Get Medicines reviewed and adjusted: Please take all your medications with you for your next visit with your Primary MD   Laboratory/radiological data: Please request your Primary MD to go over all hospital tests and procedure/radiological results at the follow up, please ask your Primary MD to get all Hospital records sent to his/her office.   In some cases, they will be blood work, cultures and biopsy results pending at the time of your discharge. Please request that your primary care M.D. goes through all the records of your hospital  data and follows up on these results.   Also Note the following: If you experience worsening of your admission symptoms, develop shortness of breath, life threatening emergency, suicidal or homicidal thoughts you must seek medical  attention immediately by calling 911 or calling your MD immediately  if symptoms less severe.   You must read complete instructions/literature along with all the possible adverse reactions/side effects for all the Medicines you take and that have been prescribed to you. Take any new Medicines after you have completely understood and accpet all the possible adverse reactions/side effects.    Do not drive when taking Pain medications or sleeping medications (Benzodaizepines)   Do not take more than prescribed Pain, Sleep and Anxiety Medications. It is not advisable to combine anxiety,sleep and pain medications without talking with your primary care practitioner   Special Instructions: If you have smoked or chewed Tobacco  in the last 2 yrs please stop smoking, stop any regular Alcohol  and or any Recreational drug use.   Wear Seat belts while driving.   Please note: You were cared for by a hospitalist during your hospital stay. Once you are discharged, your primary care physician will handle any further medical issues. Please note that NO REFILLS for any discharge medications will be authorized once you are discharged, as it is imperative that you return to your primary care physician (or establish a relationship with a primary care physician if you do not have one) for your post hospital discharge needs so that they can reassess your need for medications and monitor your lab values.  Time coordinating discharge: 35 minutes  SIGNED:  Nilda Fendt, MD, PhD 01/08/2024, 10:59 AM

## 2024-01-08 NOTE — Consult Note (Addendum)
 ELECTROPHYSIOLOGY CONSULT NOTE  Patient ID: ANTHEM FRAZER MRN: [id number], DOB/AGE: [birth year]   Admit date: 01/05/2024 Date of Consult: 01/08/2024  Primary Physician: System, Provider Not In Primary Cardiologist: None  Primary Electrophysiologist: New to Dr. Kennyth  Reason for Consultation: Cryptogenic stroke; recommendations regarding Implantable Loop Recorder Insurance: Medicare  History of Present Illness EP has been asked to evaluate Zaim A Paras for placement of an implantable loop recorder to monitor for atrial fibrillation by Dr Jerri.  The patient was admitted on 01/05/2024 with slurred speech and aphashia.    Imaging demonstrated: Stroke:  Left frontal punctate and right frontal small infarct, concerning for cardioembolic source CT no acute abnormality CT head and neck bilateral ICA bulb and siphon atherosclerosis but no significant stenosis MRI right semiovale small infarct Repeat MRI New punctate acute ischemic nonhemorrhagic infarct involving the left frontal cortex. Stable 7 mm acute ischemic nonhemorrhagic subcortical posterior right frontal lobe infarct. 2D Echo EF 60 to 65%, no PFO LE venous Doppler no DVT May consider loop recorder in a.m. LDL 117 HgbA1c 12.1 UDS positive for phentermine (home medication) Lovenox  for VTE prophylaxis aspirin  81 mg daily prior to admission, now on aspirin  81 mg daily and clopidogrel  75 mg daily DAPT for 3 weeks and then Plavix  alone. Patient counseled to be compliant with his antithrombotic medications Ongoing aggressive stroke risk factor management Therapy recommendations: Outpatient OT Disposition: Pending    The patient has been monitored on telemetry which has demonstrated sinus rhythm with no arrhythmias.  Inpatient stroke work-up will not require a TEE per Neurology.   Echocardiogram as above. Lab work is reviewed.  Prior to admission, the patient denies chest pain, shortness of breath, dizziness, palpitations, or  syncope.  He is recovering from his stroke with plans to return home  at discharge.  Allergies, Past Medical, Surgical, Social, and Family Histories have been reviewed and are referenced here-in when relevant for medical decision making.   Inpatient Medications:   aspirin  EC  81 mg Oral Daily   atorvastatin   80 mg Oral QHS   clopidogrel   75 mg Oral Daily   enoxaparin  (LOVENOX ) injection  40 mg Subcutaneous Q24H   insulin  aspart  0-5 Units Subcutaneous QHS   insulin  aspart  0-6 Units Subcutaneous TID WC   insulin  glargine-yfgn  7 Units Subcutaneous Q24H   sodium chloride  flush  3 mL Intravenous Q12H    Physical Exam: Vitals:   01/07/24 1639 01/08/24 0030 01/08/24 0434 01/08/24 0801  BP: (!) 139/98 (!) 147/96 (!) 142/87 (!) 165/99  Pulse: 78 72 73 77  Resp: 18  16 18   Temp: 98.5 F (36.9 C) 98.2 F (36.8 C) 98 F (36.7 C) 97.8 F (36.6 C)  TempSrc: Oral Oral Oral Oral  SpO2: 97% 99% 98% 99%  Weight:      Height:        GEN- NAD. A&O x 3. Normal affect. HEENT: Normocephalic, atraumatic Lungs- CTAB, Normal effort.  Heart- Regular rate and rhythm rate and rhythm. No M/G/R.  Extremities- No peripheral edema. no clubbing or cyanosis Skin- warm and dry, no rash or lesion. Neuro - slight R facial droop   12-lead ECG on arrival showed sinus tachycardia at 106 bpm (personally reviewed) All prior EKG's in EPIC reviewed with no documented atrial fibrillation  Telemetry NSR 70s (personally reviewed)  Assessment and Plan:  1. Cryptogenic stroke The patient presents with cryptogenic stroke.  The patient does not have a TEE planned for this AM.  I spoke at length with the patient about monitoring for afib with an implantable loop recorder.  Risks, benefits, and alteratives to implantable loop recorder were discussed with the patient today.   At this time, the patient is very clear in their decision to proceed with implantable loop recorder.   Wound care was reviewed with the patient  (keep incision clean and dry for 3 days). Please call with questions.   Ozell Prentice Passey, PA-C 01/08/2024 8:39 AM   I have seen, examined the patient, and reviewed the above assessment and plan.    HPI: Mr. Dantes is a 63 year old male with a past medical history notable for diabetes type 2, hypertension, hyperlipidemia, OSA, neuropathy who presented to ED on 7/4 with a chief complaint of dysarthria and expressive aphasia. MRI showed punctate acute ischemic nonhemorrhagic infarct involving the left frontal cortex. EP consulted for loop recorder implant. Patient seen today. Doing well. Neuro symptoms have resolved. No new or acute complaints.   General: Well developed, in no acute distress.  Neck: No JVD.  Cardiac: Normal rate, regular rhythm.  Resp: Normal work of breathing.  Ext: No edema.  Neuro: No gross focal deficits.  Psych: Normal affect.   Assessment and Plan:  #Cryptogenic stroke: Discussed with patient and family regarding atrial fibrillation as potential etiology for stroke. Discussed importance of prolonged monitoring for AF. Discussed role of loop recorder implant in monitoring for AF.  -Explained risks, benefits, and alternatives to ILR implantation, including but not limited to bleeding, infection, damage to nearby structures.  Pt verbalized understanding and wants to proceed.  Fonda Kitty, MD 01/08/2024 8:57 PM

## 2024-01-08 NOTE — Discharge Instructions (Signed)

## 2024-01-08 NOTE — Progress Notes (Signed)
 STROKE TEAM PROGRESS NOTE   SUBJECTIVE (INTERVAL HISTORY) His daughter is at the bedside.  Pt doing well, speech near normal. Pending loop recorder placement   OBJECTIVE Temp:  [97.6 F (36.4 C)-98.5 F (36.9 C)] 97.6 F (36.4 C) (07/07 1222) Pulse Rate:  [72-80] 80 (07/07 1222) Cardiac Rhythm: Normal sinus rhythm (07/07 0734) Resp:  [16-18] 16 (07/07 1222) BP: (139-165)/(87-100) 165/100 (07/07 1222) SpO2:  [97 %-99 %] 99 % (07/07 1222)  Recent Labs  Lab 01/07/24 1159 01/07/24 1647 01/07/24 2140 01/08/24 0609 01/08/24 1221  GLUCAP 312* 206* 251* 165* 267*   Recent Labs  Lab 01/05/24 1846 01/05/24 1850 01/06/24 0513  NA 139 138 138  K 3.8 3.8 3.5  CL 103 102 107  CO2 24  --  24  GLUCOSE 227* 224* 188*  BUN 13 16 13   CREATININE 0.97 0.90 0.90  CALCIUM  9.1  --  8.8*  MG  --   --  1.8  PHOS  --   --  2.6   Recent Labs  Lab 01/05/24 1846  AST 20  ALT 18  ALKPHOS 64  BILITOT 0.8  PROT 7.0  ALBUMIN 3.6   Recent Labs  Lab 01/05/24 1846 01/05/24 1850 01/06/24 0513  WBC 8.0  --  5.9  NEUTROABS 5.4  --   --   HGB 15.6 16.3 14.4  HCT 46.2 48.0 42.7  MCV 85.1  --  82.8  PLT 221  --  203   No results for input(s): CKTOTAL, CKMB, CKMBINDEX, TROPONINI in the last 168 hours. Recent Labs    01/05/24 1846  LABPROT 13.6  INR 1.0   No results for input(s): COLORURINE, LABSPEC, PHURINE, GLUCOSEU, HGBUR, BILIRUBINUR, KETONESUR, PROTEINUR, UROBILINOGEN, NITRITE, LEUKOCYTESUR in the last 72 hours.  Invalid input(s): APPERANCEUR     Component Value Date/Time   CHOL 184 01/06/2024 0514   TRIG 135 01/06/2024 0514   HDL 40 (L) 01/06/2024 0514   CHOLHDL 4.6 01/06/2024 0514   VLDL 27 01/06/2024 0514   LDLCALC 117 (H) 01/06/2024 0514   Lab Results  Component Value Date   HGBA1C 12.1 (H) 01/06/2024      Component Value Date/Time   LABOPIA NONE DETECTED 01/05/2024 0819   COCAINSCRNUR NONE DETECTED 01/05/2024 0819   LABBENZ NONE  DETECTED 01/05/2024 0819   AMPHETMU POSITIVE (A) 01/05/2024 0819   THCU NONE DETECTED 01/05/2024 0819   LABBARB NONE DETECTED 01/05/2024 0819    Recent Labs  Lab 01/05/24 1846  ETH <15    I have personally reviewed the radiological images below and agree with the radiology interpretations.  VAS US  LOWER EXTREMITY VENOUS (DVT) Result Date: 01/07/2024  Lower Venous DVT Study Patient Name:  KINCADE GRANBERG  Date of Exam:   01/07/2024 Medical Rec #: [id number]       Accession #:    [id number] Date of Birth: [birth year]       Patient Gender: M Patient Age:   63 years Exam Location:  [facility] Procedure:      VAS US  LOWER EXTREMITY VENOUS (DVT) Referring Phys: ARY Aune Adami --------------------------------------------------------------------------------  Indications: Embolic stroke.  Comparison Study: No priors. Performing Technologist: Ricka Sturdivant-Jones RDMS, RVT  Examination Guidelines: A complete evaluation includes B-mode imaging, spectral Doppler, color Doppler, and power Doppler as needed of all accessible portions of each vessel. Bilateral testing is considered an integral part of a complete examination. Limited examinations for reoccurring indications may be performed as noted. The reflux portion of the exam is performed  with the patient in reverse Trendelenburg.  +---------+---------------+---------+-----------+----------+--------------+ RIGHT    CompressibilityPhasicitySpontaneityPropertiesThrombus Aging +---------+---------------+---------+-----------+----------+--------------+ CFV      Full           Yes      Yes                                 +---------+---------------+---------+-----------+----------+--------------+ SFJ      Full                                                        +---------+---------------+---------+-----------+----------+--------------+ FV Prox  Full                                                         +---------+---------------+---------+-----------+----------+--------------+ FV Mid   Full                                                        +---------+---------------+---------+-----------+----------+--------------+ FV DistalFull                                                        +---------+---------------+---------+-----------+----------+--------------+ PFV      Full                                                        +---------+---------------+---------+-----------+----------+--------------+ POP      Full           Yes      Yes                                 +---------+---------------+---------+-----------+----------+--------------+ PTV      Full                                                        +---------+---------------+---------+-----------+----------+--------------+ PERO     Full                                                        +---------+---------------+---------+-----------+----------+--------------+   +---------+---------------+---------+-----------+----------+--------------+ LEFT     CompressibilityPhasicitySpontaneityPropertiesThrombus Aging +---------+---------------+---------+-----------+----------+--------------+ CFV      Full           Yes      Yes                                 +---------+---------------+---------+-----------+----------+--------------+  SFJ      Full                                                        +---------+---------------+---------+-----------+----------+--------------+ FV Prox  Full                                                        +---------+---------------+---------+-----------+----------+--------------+ FV Mid   Full                                                        +---------+---------------+---------+-----------+----------+--------------+ FV DistalFull                                                         +---------+---------------+---------+-----------+----------+--------------+ PFV      Full                                                        +---------+---------------+---------+-----------+----------+--------------+ POP      Full           Yes      Yes                                 +---------+---------------+---------+-----------+----------+--------------+ PTV      Full                                                        +---------+---------------+---------+-----------+----------+--------------+ PERO     Full                                                        +---------+---------------+---------+-----------+----------+--------------+     Summary: BILATERAL: - No evidence of deep vein thrombosis seen in the lower extremities, bilaterally. -No evidence of popliteal cyst, bilaterally.   *See table(s) above for measurements and observations.    Preliminary    MR BRAIN WO CONTRAST Result Date: 01/06/2024 CLINICAL DATA:  Follow-up examination for stroke. EXAM: MRI HEAD WITHOUT CONTRAST TECHNIQUE: Multiplanar, multiecho pulse sequences of the brain and surrounding structures were obtained without intravenous contrast. COMPARISON:  Prior studies from 01/05/2024 FINDINGS: Brain: Limited study with diffusion-weighted imaging only was performed. Previously identified 7 mm acute ischemic nonhemorrhagic infarct involving the subcortical posterior right frontal lobe again seen, stable. There is  a new punctate acute ischemic infarct involving the contralateral left frontal cortex (series 2, image 38). No other evidence for new or interval infarction elsewhere. No other new abnormality seen on this limited exam. Vascular: Not well assessed on this limited exam. Skull and upper cervical spine: Not well assessed on this limited exam. Sinuses/Orbits: Not well assessed on this limited exam. Other: None. IMPRESSION: 1. New punctate acute ischemic nonhemorrhagic infarct involving the left  frontal cortex. 2. Stable 7 mm acute ischemic nonhemorrhagic subcortical posterior right frontal lobe infarct. Electronically Signed   By: Morene Hoard M.D.   On: 01/06/2024 17:45   ECHOCARDIOGRAM COMPLETE Result Date: 01/06/2024    ECHOCARDIOGRAM REPORT   Patient Name:   JAMILE SIVILS Date of Exam: 01/06/2024 Medical Rec #:  [id number]      Height:       70.0 in Accession #:    [id number]     Weight:       190.0 lb Date of Birth:  24-Jun-1961      BSA:          2.042 m Patient Age:    63 years       BP:           143/95 mmHg Patient Gender: M              HR:           80 bpm. Exam Location:  Inpatient Procedure: 2D Echo, Color Doppler, Cardiac Doppler and Saline Contrast Bubble            Study (Both Spectral and Color Flow Doppler were utilized during            procedure). Indications:    TIA  History:        Patient has no prior history of Echocardiogram examinations.                 Risk Factors:Hypertension, Diabetes, Dyslipidemia and Sleep                 Apnea.  Sonographer:    Damien Senior RDCS Referring Phys: [id number] DORN DAWSON IMPRESSIONS  1. Left ventricular ejection fraction, by estimation, is 60 to 65%. The left ventricle has normal function. The left ventricle has no regional wall motion abnormalities. There is mild concentric left ventricular hypertrophy. Left ventricular diastolic parameters are consistent with Grade I diastolic dysfunction (impaired relaxation).  2. Right ventricular systolic function is normal. The right ventricular size is normal. Tricuspid regurgitation signal is inadequate for assessing PA pressure.  3. Agitated saline contrast bubble study was negative, with no evidence of any interatrial shunt.  4. The mitral valve is grossly normal. Trivial mitral valve regurgitation.  5. The aortic valve is tricuspid. There is mild calcification of the aortic valve. Aortic valve regurgitation is not visualized. Aortic valve sclerosis is present, with no evidence of aortic valve  stenosis.  6. The inferior vena cava is normal in size with greater than 50% respiratory variability, suggesting right atrial pressure of 3 mmHg. Comparison(s): No prior Echocardiogram. FINDINGS  Left Ventricle: Left ventricular ejection fraction, by estimation, is 60 to 65%. The left ventricle has normal function. The left ventricle has no regional wall motion abnormalities. The left ventricular internal cavity size was normal in size. There is  mild concentric left ventricular hypertrophy. Left ventricular diastolic parameters are consistent with Grade I diastolic dysfunction (impaired relaxation). Right Ventricle: The right ventricular size is normal. No increase in right ventricular  wall thickness. Right ventricular systolic function is normal. Tricuspid regurgitation signal is inadequate for assessing PA pressure. Left Atrium: Left atrial size was normal in size. Right Atrium: Right atrial size was normal in size. Pericardium: There is no evidence of pericardial effusion. Mitral Valve: The mitral valve is grossly normal. Trivial mitral valve regurgitation. Tricuspid Valve: The tricuspid valve is grossly normal. Tricuspid valve regurgitation is trivial. Aortic Valve: The aortic valve is tricuspid. There is mild calcification of the aortic valve. There is mild aortic valve annular calcification. Aortic valve regurgitation is not visualized. Aortic valve sclerosis is present, with no evidence of aortic valve stenosis. Pulmonic Valve: The pulmonic valve was grossly normal. Pulmonic valve regurgitation is trivial. Aorta: The aortic root and ascending aorta are structurally normal, with no evidence of dilitation. Venous: The inferior vena cava is normal in size with greater than 50% respiratory variability, suggesting right atrial pressure of 3 mmHg. IAS/Shunts: No atrial level shunt detected by color flow Doppler. Agitated saline contrast was given intravenously to evaluate for intracardiac shunting. Agitated saline  contrast bubble study was negative, with no evidence of any interatrial shunt. Additional Comments: 3D was performed not requiring image post processing on an independent workstation and was indeterminate.  LEFT VENTRICLE PLAX 2D LVIDd:         3.50 cm   Diastology LVIDs:         2.40 cm   LV e' medial:    9.25 cm/s LV PW:         1.30 cm   LV E/e' medial:  6.0 LV IVS:        1.30 cm   LV e' lateral:   7.83 cm/s LVOT diam:     2.10 cm   LV E/e' lateral: 7.1 LV SV:         56 LV SV Index:   28 LVOT Area:     3.46 cm  RIGHT VENTRICLE RV S prime:     13.10 cm/s TAPSE (M-mode): 2.1 cm LEFT ATRIUM             Index        RIGHT ATRIUM           Index LA diam:        3.60 cm 1.76 cm/m   RA Area:     12.10 cm LA Vol (A2C):   31.9 ml 15.62 ml/m  RA Volume:   25.90 ml  12.68 ml/m LA Vol (A4C):   33.0 ml 16.16 ml/m LA Biplane Vol: 32.3 ml 15.82 ml/m  AORTIC VALVE LVOT Vmax:   90.20 cm/s LVOT Vmean:  66.600 cm/s LVOT VTI:    0.163 m  AORTA Ao Root diam: 3.20 cm Ao Asc diam:  3.10 cm MITRAL VALVE MV Area (PHT): 2.42 cm    SHUNTS MV Decel Time: 313 msec    Systemic VTI:  0.16 m MV E velocity: 55.50 cm/s  Systemic Diam: 2.10 cm MV A velocity: 66.00 cm/s MV E/A ratio:  0.84 Jayson Sierras MD Electronically signed by Jayson Sierras MD Signature Date/Time: 01/06/2024/4:57:59 PM    Final    DG CHEST PORT 1 VIEW Result Date: 01/05/2024 CLINICAL DATA:  Cough.  Aphasia. EXAM: PORTABLE CHEST 1 VIEW COMPARISON:  10/17/2017 FINDINGS: Shallow inspiration. Heart size and pulmonary vascularity are normal. Lungs are clear. No pleural effusion or pneumothorax. Mediastinal contours appear intact. Calcification of the aorta. Degenerative changes in the spine. IMPRESSION: No active disease. Electronically Signed   By: Elsie  Mannie M.D.   On: 01/05/2024 22:07   CT ANGIO HEAD NECK W WO CM Result Date: 01/05/2024 CLINICAL DATA:  Initial evaluation for acute stroke. EXAM: CT ANGIOGRAPHY HEAD AND NECK WITH AND WITHOUT CONTRAST  TECHNIQUE: Multidetector CT imaging of the head and neck was performed using the standard protocol during bolus administration of intravenous contrast. Multiplanar CT image reconstructions and MIPs were obtained to evaluate the vascular anatomy. Carotid stenosis measurements (when applicable) are obtained utilizing NASCET criteria, using the distal internal carotid diameter as the denominator. RADIATION DOSE REDUCTION: This exam was performed according to the departmental dose-optimization program which includes automated exposure control, adjustment of the mA and/or kV according to patient size and/or use of iterative reconstruction technique. CONTRAST:  75mL OMNIPAQUE  IOHEXOL  350 MG/ML SOLN COMPARISON: Prior CT and MRI from earlier the same day. FINDINGS: CTA NECK FINDINGS Aortic arch: Visualized aortic arch within normal limits for caliber with standard branch pattern. Aortic atherosclerosis. No significant stenosis about the origin the great vessels. Right carotid system: Right common and internal carotid arteries are patent without dissection. Minimal atheromatous change about the right carotid bulb without stenosis. Left carotid system: Left common and internal carotid arteries are patent without dissection. Mild atheromatous change about the left carotid bulb without stenosis. Vertebral arteries: Left vertebral artery arises directly from the aortic arch. Right vertebral artery dominant. Vertebral arteries are patent without stenosis or dissection. Skeleton: No discrete or worrisome osseous lesions. Other neck: No other acute finding. Upper chest: No other acute finding. Review of the MIP images confirms the above findings CTA HEAD FINDINGS Anterior circulation: Atheromatous change about the carotid siphons without hemodynamically significant stenosis. A1 segments patent bilaterally. Normal anterior communicating artery complex. Anterior cerebral arteries patent without significant stenosis. No M1 stenosis or  occlusion. Distal MCA branches perfused and symmetric. Posterior circulation: Both V4 segments patent without significant stenosis. Both PICA patent. Basilar patent without stenosis. Superior cerebellar and posterior cerebral arteries patent bilaterally. Venous sinuses: Grossly patent allowing for timing the contrast bolus. Anatomic variants: As above.  No aneurysm. Review of the MIP images confirms the above findings IMPRESSION: 1. Negative CTA for large vessel occlusion or other emergent finding. 2. Mild atheromatous change about the carotid bifurcations and carotid siphons without hemodynamically significant stenosis. 3.  Aortic Atherosclerosis (ICD10-I70.0). Electronically Signed   By: Morene Hoard M.D.   On: 01/05/2024 21:42   MR BRAIN WO CONTRAST Result Date: 01/05/2024 CLINICAL DATA:  Initial evaluation for acute TIA. EXAM: MRI HEAD WITHOUT CONTRAST TECHNIQUE: Multiplanar, multiecho pulse sequences of the brain and surrounding structures were obtained without intravenous contrast. COMPARISON:  Prior CT from earlier the same day. FINDINGS: Brain: Cerebral volume within normal limits. Patchy T2/FLAIR hyperintensity involving the supratentorial cerebral white matter, consistent with chronic small vessel ischemic disease, moderate in nature. 7 mm focus of diffusion signal abnormality seen involving the subcortical posterior right frontal lobe (series 5, image 87), consistent with a small acute ischemic infarct. No associated hemorrhage or mass effect. No other evidence for acute or subacute ischemia. No acute or chronic intracranial blood products. No mass lesion, midline shift or mass effect. No hydrocephalus or extra-axial fluid collection. Pituitary gland within normal limits. Vascular: Major intracranial vascular flow voids are maintained. Skull and upper cervical spine: Craniocervical junction within normal limits. Bone marrow signal intensity normal. No scalp soft tissue abnormality.  Sinuses/Orbits: Globes orbital soft tissues within normal limits. Mild-to-moderate mucosal thickening about the ethmoidal air cells and right maxillary sinus. 12 mm T2 hyperintense  lesion within the right nasal cavity, nonspecific, but could potentially reflect a polyp (series 10, image 4). No mastoid effusion. Other: None. IMPRESSION: 1. 7 mm acute ischemic nonhemorrhagic subcortical posterior right frontal lobe infarct. 2. Underlying moderate chronic microvascular ischemic disease. 3. 12 mm T2 hyperintense lesion within the right nasal cavity, nonspecific, but could potentially reflect a polyp. Correlation with physical exam suggested. Electronically Signed   By: Morene Hoard M.D.   On: 01/05/2024 21:18   CT HEAD CODE STROKE WO CONTRAST Result Date: 01/05/2024 CLINICAL DATA:  Code stroke.  Neuro deficit, concern for stroke. EXAM: CT HEAD WITHOUT CONTRAST TECHNIQUE: Contiguous axial images were obtained from the base of the skull through the vertex without intravenous contrast. RADIATION DOSE REDUCTION: This exam was performed according to the departmental dose-optimization program which includes automated exposure control, adjustment of the mA and/or kV according to patient size and/or use of iterative reconstruction technique. COMPARISON:  None Available. FINDINGS: Brain: No acute intracranial hemorrhage. No CT evidence of acute infarct. Nonspecific hypoattenuation in the periventricular and subcortical white matter favored to reflect chronic microvascular ischemic changes. No edema, mass effect, or midline shift. The basilar cisterns are patent. Ventricles: The ventricles are normal. Vascular: Atherosclerotic calcifications of the carotid siphons. No hyperdense vessel. Skull: No acute or aggressive finding. Orbits: Orbits are symmetric. Sinuses: Mucosal thickening in the right maxillary sinus. Additional mild mucosal thickening in the ethmoid sinuses, right greater than left. Other: Mastoid air cells  are clear. ASPECTS [facility] Stroke Program Early CT Score) - Ganglionic level infarction (caudate, lentiform nuclei, internal capsule, insula, M1-M3 cortex): 7 - Supraganglionic infarction (M4-M6 cortex): 3 Total score (0-10 with 10 being normal): 10 IMPRESSION: 1. No CT evidence of acute intracranial abnormality. 2. Chronic microvascular ischemic changes. 3. ASPECTS is 10 These results were communicated to Dr. Vanessa at 7:05 pm on 01/05/2024 by text page via the [facility] messaging system. Electronically Signed   By: Donnice Mania M.D.   On: 01/05/2024 19:06     PHYSICAL EXAM  Temp:  [97.6 F (36.4 C)-98.5 F (36.9 C)] 97.6 F (36.4 C) (07/07 1222) Pulse Rate:  [72-80] 80 (07/07 1222) Resp:  [16-18] 16 (07/07 1222) BP: (139-165)/(87-100) 165/100 (07/07 1222) SpO2:  [97 %-99 %] 99 % (07/07 1222)  General - Well nourished, well developed, in no apparent distress.  Ophthalmologic - fundi not visualized due to noncooperation.  Cardiovascular - Regular rhythm and rate.  Mental Status -  Level of arousal and orientation to time, place, and person were intact. Language including expression, naming, repetition, comprehension was assessed and found intact. Fund of Knowledge was assessed and was intact except occasional word finding difficulty.  Cranial Nerves II - XII - II - Visual field intact OU. III, IV, VI - Extraocular movements intact. V - Facial sensation intact bilaterally. VII - slight right facial droop VIII - Hearing & vestibular intact bilaterally. X - Palate elevates symmetrically. XI - Chin turning & shoulder shrug intact bilaterally. XII - Tongue protrusion intact.  Motor Strength - The patient's strength was normal in all extremities and pronator drift was absent.  Bulk was normal and fasciculations were absent.   Motor Tone - Muscle tone was assessed at the neck and appendages and was normal.  Reflexes - The patient's reflexes were symmetrical in all extremities and he  had no pathological reflexes.  Sensory - Light touch, temperature/pinprick were assessed and were symmetrical.    Coordination - The patient had normal movements in the hands and  feet with no ataxia or dysmetria.  Tremor was absent.  Gait and Station - deferred.   ASSESSMENT/PLAN Mr. RAYLIN WINER is a 63 y.o. male with history of diabetes, hypertension, hyperlipidemia, kidney stone admitted for slurred speech, aphasia.  Improved on arrival.  No TNK given due to mild symptoms.    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 Stable 7 mm acute ischemic nonhemorrhagic subcortical posterior right frontal lobe infarct. 2D Echo EF 60 to 65%, no PFO LE venous Doppler no DVT Pending loop recorder prior to discharge LDL 117 HgbA1c 12.1 UDS positive for phentermine (home medication) Lovenox  for VTE prophylaxis aspirin  81 mg daily prior to admission, now on aspirin  81 mg daily and clopidogrel  75 mg daily DAPT for 3 weeks and then Plavix  alone. Patient counseled to be compliant with his antithrombotic medications Ongoing aggressive stroke risk factor management Therapy recommendations: Outpatient OT Disposition: Pending  Diabetes HgbA1c 12.1 goal < 7.0 Uncontrolled Currently on insulin  CBG monitoring SSI DM education and close PCP follow up  Hypertension Stable Long term BP goal normotensive  Hyperlipidemia Home meds: Lipitor  80, questionable compliance LDL 117, goal < 70 Now on Lipitor  80 Continue statin at discharge  Other Stroke Risk Factors Advanced age  Other Active Problems Kidney stone  Hospital day # 2  Neurology will sign off. Please call with questions. Pt will  follow up with stroke clinic NP at [facility] in about 4 weeks. Thanks for the consult.   Ary Cummins, MD PhD Stroke Neurology 01/08/2024 1:24 PM    To contact Stroke Continuity provider, please refer to [email or web address]. After hours, contact General Neurology

## 2024-01-09 ENCOUNTER — Encounter (HOSPITAL_COMMUNITY): Payer: Self-pay | Admitting: Student

## 2024-01-25 ENCOUNTER — Telehealth: Payer: Self-pay

## 2024-01-25 DIAGNOSIS — R2990 Unspecified symptoms and signs involving the nervous system: Secondary | ICD-10-CM

## 2024-01-25 NOTE — Transitions of Care (Post Inpatient/ED Visit) (Signed)
 Stroke Discharge Follow-up   01/25/2024 Name:  EDWORD CU MRN:  [id number] DOB:  April 16, 1961  Subjective: Charles Burton is a 63 y.o. year old male who is a primary care patient of System, Provider Not In An Emmi alert was received indicating patient responded to questions: Went to follow-up appointment?. I reached out by phone to follow up on the alert and spoke to no one. Placed call to patient with no answer and left a VM requesting a call back. .  Care Management Interventions: placed call- no answer Requested call back. Provided contact information.  Follow up plan: Follow up call scheduled for 24 hours. Alan Ee, RN, BSN, CEN [employer]- Transition of Care Team.  [facility] [phone]

## 2024-01-25 NOTE — Transitions of Care (Post Inpatient/ED Visit) (Signed)
 Stroke Discharge Follow-up   01/25/2024 Name:  XZAVIEN HARADA MRN:  [id number] DOB:  1960/08/16  Subjective: Benedetta DELENA Dozier is a 63 y.o. year old male who is a primary care patient of System, Provider Not In An Emmi alert was received indicating patient responded to questions: Went to follow-up appointment?. I reached out by phone to follow up on the alert and spoke to Patient. Patient reports that since the automated call, he has scheduled a PCP follow up with [facility] in [place].  Patient has recently moved and not established a new PCP in [place].  Patient has follow up scheduled with neurology as well.   Care Management Interventions: Confirmed that patient has all his medications and and is taking as prescribed.  Reviewed high A1c and patient reports that he is now taking his medications as prescribed and monitoring CBG with dexacom. Current reading during this call is 194.  Confirmed patient is taking his insulin  and his oral medications.  Reviewed long term affect of DM complications.  Patient reports that his speech has improved.  Denies any additional concerns. Reviewed with patient that he would continue to get automated calls. He voiced understanding and was appreciative of call.    Follow up plan: No further intervention required.   Alan Ee, RN, BSN, CEN [employer]- Transition of Care Team.  [facility] [phone]

## 2024-02-02 NOTE — Progress Notes (Signed)
 PATIENT: Charles Burton DOB: April 12, 1961  REASON FOR VISIT: follow up HISTORY FROM: patient PRIMARY NEUROLOGIST: Dr. Rosemarie  Chief Complaint  Patient presents with   Follow-up    Pt in 4 alone Pt here for stroke f/u Pt states wants to discuss medications and loop recorder that was placed. Pt states blood vessel busted in right eye      HISTORY OF PRESENT ILLNESS: Today 02/05/24   Charles Burton is a 63 y.o. 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  He returns today for follow-up.  He was admitted on July 4 for garbled speech.  He was not given TN K as his symptoms were mild and resolved.  MRI did show small stroke.  A loop recorder was then placed.  He has been on aspirin  and Plavix .  He states that he has continued this.  He remains on Lipitor  for his cholesterol.  Following with his PCP as his hemoglobin A1c was 12.1.  He states that he has not had any additional or residual strokelike symptoms.  He does note that he wakes frequently at night to go to the bathroom.  He does feel tired throughout the day.  Unsure if this is related to his diabetes or sleep related issue.  He states that he smokes a cigar once every 3 months.  Does not typically drink alcohol.  Reports that 3 days ago he had a blood vessel that burst in his right eye.  He states that it has remained about the same.  No worse.  He has had this happen before in the past.  Blood pressure is in normal range today.  I did advise that if this gets worse or does not get any better he should let us  know or consult with his PCP. returns today for an evaluation.   Imaging:   MRI BRAIN 7/5/25IMPRESSION: 1. New punctate acute ischemic nonhemorrhagic infarct involving the left frontal cortex. 2. Stable 7 mm acute ischemic nonhemorrhagic subcortical posterior right frontal lobe infarct.  CTA head and neck:   MPRESSION: 1. Negative CTA for  large vessel occlusion or other emergent finding. 2. Mild atheromatous change about the carotid bifurcations and carotid siphons without hemodynamically significant stenosis. 3.  Aortic Atherosclerosis (ICD10-I70.0).     HISTORY (copied from Hospital) Charles Burton is a 63 y.o. male with history of diabetes, hypertension, hyperlipidemia, kidney stone admitted for slurred speech, aphasia.  Improved on arrival.  No TNK given due to mild symptoms.     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 Stable 7 mm acute ischemic nonhemorrhagic subcortical posterior right frontal lobe infarct. 2D Echo EF 60 to 65%, no PFO LE venous Doppler no DVT Pending loop recorder prior to discharge LDL 117 HgbA1c 12.1 UDS positive for phentermine (home medication) Lovenox  for VTE prophylaxis aspirin  81 mg daily prior to admission, now on aspirin  81 mg daily and clopidogrel  75 mg daily DAPT for 3 weeks and then Plavix  alone. Patient counseled to be compliant with his antithrombotic medications Ongoing aggressive stroke risk factor management Therapy recommendations: Outpatient OT Disposition: Pending   Diabetes HgbA1c 12.1 goal < 7.0 Uncontrolled Currently on insulin  CBG monitoring  SSI DM education and close PCP follow up   Hypertension Stable Long term BP goal normotensive   Hyperlipidemia Home meds: Lipitor  80, questionable compliance LDL 117, goal < 70 Now on Lipitor  80 Continue statin at discharge    REVIEW OF SYSTEMS: Out of a complete 14 system review of symptoms, the patient complains only of the following symptoms, and all other reviewed systems are negative.  ESS  17  ALLERGIES: No Known Allergies  HOME MEDICATIONS: Outpatient Medications Prior to Visit  Medication Sig Dispense Refill   amphetamine-dextroamphetamine (ADDERALL XR) 20 MG 24 hr capsule Take 40 mg by mouth daily.     atorvastatin  (LIPITOR ) 80 MG tablet Take 80 mg by mouth daily.     clopidogrel  (PLAVIX ) 75 MG tablet Take 1 tablet (75 mg total) by mouth daily. 90 tablet 0   glimepiride (AMARYL) 2 MG tablet Take 2 mg by mouth daily before breakfast.     insulin  glargine (LANTUS  SOLOSTAR) 100 UNIT/ML Solostar Pen Inject 8 Units into the skin daily. 15 mL 0   Insulin  Pen Needle 32G X 4 MM MISC Use daily as directed. 100 each 0   lisinopril (PRINIVIL,ZESTRIL) 20 MG tablet Take 20 mg by mouth daily.     metFORMIN (GLUCOPHAGE-XR) 500 MG 24 hr tablet Take 1,000 mg by mouth daily with breakfast.     Multiple Vitamins-Minerals (ONE-A-DAY 50 PLUS PO) Take by mouth.     tadalafil (CIALIS) 20 MG tablet Take 20 mg by mouth daily as needed for erectile dysfunction.     buPROPion (WELLBUTRIN XL) 300 MG 24 hr tablet Take 300 mg by mouth daily. (Patient not taking: Reported on 02/05/2024)     Continuous Glucose Sensor (FREESTYLE LIBRE 3 SENSOR) MISC Place 1 sensor on the skin every 14 days. Use to check glucose continuously (Patient not taking: Reported on 02/05/2024) 2 each 0   No facility-administered medications prior to visit.    PAST MEDICAL HISTORY: Past Medical History:  Diagnosis Date   Anxiety    Diabetes mellitus without complication (HCC)    GERD (gastroesophageal reflux disease)    Hyperlipidemia    Hypertension    Kidney stones    Low testosterone     PAST SURGICAL HISTORY: Past Surgical History:  Procedure Laterality Date   KIDNEY STONE SURGERY  2000   KNEE CARTILAGE SURGERY  1980   LOOP RECORDER INSERTION N/A 01/08/2024   Procedure: LOOP RECORDER INSERTION;  Surgeon: Lesia Ozell Barter, PA-C;  Location: [facility];  Service: Cardiovascular;  Laterality: N/A;   NOSE  SURGERY  1978   THYROIDECTOMY      FAMILY HISTORY: Family History  Problem Relation Age of Onset   Stroke Father     SOCIAL HISTORY: Social History   Socioeconomic History   Marital status: Legally Separated    Spouse name: Not on file   Number of children: Not on file   Years of education: Not on file   Highest education level: Not on file  Occupational History   Not on file  Tobacco Use   Smoking status: Former    Types: Cigars   Smokeless tobacco: Not on file  Vaping Use   Vaping status: Never Used  Substance and Sexual Activity   Alcohol use: Yes   Drug use: Never   Sexual activity: Not on file  Other Topics Concern   Not on file  Social History Narrative   Pt lives alone    [place]    Social  Drivers of Corporate investment banker Strain: Not on file  Food Insecurity: No Food Insecurity (01/25/2024)   Hunger Vital Sign    Worried About Running Out of Food in the Last Year: Never true    Ran Out of Food in the Last Year: Never true  Transportation Needs: No Transportation Needs (01/25/2024)   PRAPARE - Administrator, Civil Service (Medical): No    Lack of Transportation (Non-Medical): No  Physical Activity: Not on file  Stress: Not on file  Social Connections: Moderately Integrated (01/06/2024)   Social Connection and Isolation Panel    Frequency of Communication with Friends and Family: More than three times a week    Frequency of Social Gatherings with Friends and Family: Three times a week    Attends Religious Services: More than 4 times per year    Active Member of Clubs or Organizations: Yes    Attends Banker Meetings: More than 4 times per year    Marital Status: Divorced  Intimate Partner Violence: Not At Risk (01/25/2024)   Humiliation, Afraid, Rape, and Kick questionnaire    Fear of Current or Ex-Partner: No    Emotionally Abused: No    Physically Abused: No    Sexually Abused: No      PHYSICAL EXAM  Vitals:    02/05/24 0859  BP: 109/66  Pulse: 86  Weight: 168 lb (76.2 kg)  Height: 5' 10 (1.778 m)   Body mass index is 24.11 kg/m.  Generalized: Well developed, in no acute distress   Neurological examination  Mentation: Alert oriented to time, place, history taking. Follows all commands speech and language fluent Cranial nerve II-XII: Pupils were equal round reactive to light. Extraocular movements were full, visual field were full on confrontational test.  Previous bleeding noted in the lower portion of the right eye.  Facial sensation and strength were normal. Uvula tongue midline. Head turning and shoulder shrug  were normal and symmetric. Motor: The motor testing reveals 5 over 5 strength of all 4 extremities. Good symmetric motor tone is noted throughout.  Sensory: Sensory testing is intact to soft touch on all 4 extremities. No evidence of extinction is noted.  Coordination: Cerebellar testing reveals good finger-nose-finger and heel-to-shin bilaterally.  Gait and station: Gait is normal.  Reflexes: Deep tendon reflexes are symmetric and normal bilaterally.   DIAGNOSTIC DATA (LABS, IMAGING, TESTING) - I reviewed patient records, labs, notes, testing and imaging myself where available.  Lab Results  Component Value Date   WBC 5.9 01/06/2024   HGB 14.4 01/06/2024   HCT 42.7 01/06/2024   MCV 82.8 01/06/2024   PLT 203 01/06/2024      Component Value Date/Time   NA 138 01/06/2024 0513   K 3.5 01/06/2024 0513   CL 107 01/06/2024 0513   CO2 24 01/06/2024 0513   GLUCOSE 188 (H) 01/06/2024 0513   BUN 13 01/06/2024 0513   CREATININE 0.90 01/06/2024 0513   CALCIUM  8.8 (L) 01/06/2024 0513   PROT 7.0 01/05/2024 1846   ALBUMIN 3.6 01/05/2024 1846   AST 20 01/05/2024 1846   ALT 18 01/05/2024 1846   ALKPHOS 64 01/05/2024 1846   BILITOT 0.8 01/05/2024 1846   GFRNONAA >60 01/06/2024 0513   Lab Results  Component Value Date   CHOL 184 01/06/2024   HDL 40 (L) 01/06/2024   LDLCALC 117  (H) 01/06/2024   TRIG 135 01/06/2024   CHOLHDL 4.6 01/06/2024   Lab Results  Component  Value Date   HGBA1C 12.1 (H) 01/06/2024   No results found for: VITAMINB12 No results found for: TSH    ASSESSMENT AND PLAN 63 y.o. year old male  has a past medical history of Anxiety, Diabetes mellitus without complication (HCC), GERD (gastroesophageal reflux disease), Hyperlipidemia, Hypertension, Kidney stones, and Low testosterone. 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   Discussed secondary stroke prevention measures and importance of close PCP follow up for aggressive stroke risk factor management. I have gone over the pathophysiology of stroke, warning signs and symptoms, risk factors and their management in some detail with instructions to go to the closest emergency room for symptoms of concern. HTN: BP goal <130/90.  Stable  HLD: LDL goal <70. Recent LDL 117 on Lipitor  DMII: A1c goal<7.0. Recent A1c 12.1 managed by PCP We will order home sleep test to rule out obstructive sleep apnea Encouraged patient to monitor diet and encouraged exercise FU with our office 6 to 8 months or sooner if needed  Orders Placed This Encounter  Procedures   Home sleep test        Duwaine Russell, MSN, NP-C 02/05/2024, 10:45 AM [facility] [address], [address] [place], [place] [address] [phone]

## 2024-02-05 ENCOUNTER — Encounter: Payer: Self-pay | Admitting: Adult Health

## 2024-02-05 ENCOUNTER — Ambulatory Visit (INDEPENDENT_AMBULATORY_CARE_PROVIDER_SITE_OTHER): Payer: Self-pay | Admitting: Adult Health

## 2024-02-05 VITALS — BP 109/66 | HR 86 | Ht 70.0 in | Wt 168.0 lb

## 2024-02-05 DIAGNOSIS — E785 Hyperlipidemia, unspecified: Secondary | ICD-10-CM

## 2024-02-05 DIAGNOSIS — I639 Cerebral infarction, unspecified: Secondary | ICD-10-CM

## 2024-02-05 DIAGNOSIS — R400 Somnolence: Secondary | ICD-10-CM | POA: Diagnosis not present

## 2024-02-05 NOTE — Progress Notes (Signed)
 I agree with the above plan

## 2024-02-05 NOTE — Patient Instructions (Signed)
 Your Plan:  Continue Plavix  STOP Aspirin    Blood pressure goal <130/90 Cholesterol LDL goal <70 Diabetes goal A1c <7 Monitor diet and try to exercise  Home sleep test ordered  Thank you for coming to see us  at [facility]. I hope we have been able to provide you high quality care today.  You may receive a patient satisfaction survey over the next few weeks. We would appreciate your feedback and comments so that we may continue to improve ourselves and the health of our patients.

## 2024-02-09 ENCOUNTER — Ambulatory Visit

## 2024-02-09 DIAGNOSIS — I639 Cerebral infarction, unspecified: Secondary | ICD-10-CM

## 2024-02-12 LAB — CUP PACEART REMOTE DEVICE CHECK
Date Time Interrogation Session: 20250808221240
Implantable Pulse Generator Implant Date: 20250707

## 2024-02-14 ENCOUNTER — Ambulatory Visit: Payer: Self-pay | Admitting: Cardiology

## 2024-02-14 ENCOUNTER — Inpatient Hospital Stay: Payer: Self-pay | Admitting: Adult Health

## 2024-02-19 ENCOUNTER — Other Ambulatory Visit (HOSPITAL_COMMUNITY): Payer: Self-pay

## 2024-03-11 ENCOUNTER — Ambulatory Visit (INDEPENDENT_AMBULATORY_CARE_PROVIDER_SITE_OTHER)

## 2024-03-11 DIAGNOSIS — I639 Cerebral infarction, unspecified: Secondary | ICD-10-CM | POA: Diagnosis not present

## 2024-03-12 LAB — CUP PACEART REMOTE DEVICE CHECK
Date Time Interrogation Session: 20250908221423
Implantable Pulse Generator Implant Date: 20250707

## 2024-03-21 NOTE — Progress Notes (Signed)
 Remote Loop Recorder Transmission

## 2024-03-24 ENCOUNTER — Ambulatory Visit: Payer: Self-pay | Admitting: Cardiology

## 2024-03-29 NOTE — Progress Notes (Signed)
 Remote Loop Recorder Transmission

## 2024-04-11 ENCOUNTER — Encounter

## 2024-04-11 ENCOUNTER — Ambulatory Visit: Payer: Self-pay

## 2024-04-11 DIAGNOSIS — I639 Cerebral infarction, unspecified: Secondary | ICD-10-CM | POA: Diagnosis not present

## 2024-04-12 LAB — CUP PACEART REMOTE DEVICE CHECK
Date Time Interrogation Session: 20251009221216
Implantable Pulse Generator Implant Date: 20250707

## 2024-04-15 ENCOUNTER — Ambulatory Visit: Payer: Self-pay | Admitting: Cardiology

## 2024-04-16 ENCOUNTER — Other Ambulatory Visit (HOSPITAL_COMMUNITY): Payer: Self-pay

## 2024-04-16 NOTE — Progress Notes (Signed)
 Remote Loop Recorder Transmission

## 2024-05-12 ENCOUNTER — Ambulatory Visit (INDEPENDENT_AMBULATORY_CARE_PROVIDER_SITE_OTHER): Payer: Self-pay

## 2024-05-12 DIAGNOSIS — I639 Cerebral infarction, unspecified: Secondary | ICD-10-CM

## 2024-05-13 ENCOUNTER — Encounter

## 2024-05-13 LAB — CUP PACEART REMOTE DEVICE CHECK
Date Time Interrogation Session: 20251109221213
Implantable Pulse Generator Implant Date: 20250707

## 2024-05-15 NOTE — Progress Notes (Signed)
 Remote Loop Recorder Transmission

## 2024-05-17 ENCOUNTER — Ambulatory Visit: Payer: Self-pay | Admitting: Cardiology

## 2024-06-12 ENCOUNTER — Ambulatory Visit: Payer: Self-pay

## 2024-06-13 ENCOUNTER — Encounter

## 2024-06-13 LAB — CUP PACEART REMOTE DEVICE CHECK
Date Time Interrogation Session: 20251210221215
Implantable Pulse Generator Implant Date: 20250707

## 2024-06-19 NOTE — Progress Notes (Signed)
 Remote Loop Recorder Transmission

## 2024-07-13 ENCOUNTER — Ambulatory Visit: Payer: Self-pay

## 2024-07-13 DIAGNOSIS — I639 Cerebral infarction, unspecified: Secondary | ICD-10-CM

## 2024-07-15 ENCOUNTER — Encounter

## 2024-07-15 LAB — CUP PACEART REMOTE DEVICE CHECK
Date Time Interrogation Session: 20260110220943
Implantable Pulse Generator Implant Date: 20250707

## 2024-07-16 NOTE — Progress Notes (Signed)
 Remote Loop Recorder Transmission

## 2024-07-21 ENCOUNTER — Ambulatory Visit: Payer: Self-pay | Admitting: Cardiology

## 2024-08-15 ENCOUNTER — Encounter

## 2024-09-16 ENCOUNTER — Encounter

## 2024-10-22 ENCOUNTER — Ambulatory Visit: Admitting: Adult Health
